# Patient Record
Sex: Female | Born: 1975 | Race: White | Hispanic: Yes | Smoking: Never smoker
Health system: Southern US, Community
[De-identification: ages and names within clinical notes are randomized; demographics above are authoritative.]

## PROBLEM LIST (undated history)

## (undated) DIAGNOSIS — I10 Essential (primary) hypertension: Secondary | ICD-10-CM

## (undated) DIAGNOSIS — Z0000 Encounter for general adult medical examination without abnormal findings: Secondary | ICD-10-CM

## (undated) HISTORY — DX: Encounter for general adult medical examination without abnormal findings: Z00.00

## (undated) HISTORY — PX: KNEE ARTHROSCOPY: SHX127

## (undated) HISTORY — PX: CHOLECYSTECTOMY: SHX55

## (undated) HISTORY — PX: TUBAL LIGATION: SHX77

## (undated) HISTORY — PX: APPENDECTOMY: SHX54

---

## 2005-03-17 ENCOUNTER — Ambulatory Visit: Payer: Self-pay | Admitting: Family Medicine

## 2005-04-15 ENCOUNTER — Ambulatory Visit: Payer: Self-pay | Admitting: Family Medicine

## 2005-11-17 ENCOUNTER — Ambulatory Visit: Payer: Self-pay | Admitting: Family Medicine

## 2006-11-18 ENCOUNTER — Emergency Department: Payer: Self-pay | Admitting: Emergency Medicine

## 2006-12-07 ENCOUNTER — Encounter: Payer: Self-pay | Admitting: Family Medicine

## 2006-12-14 ENCOUNTER — Encounter: Payer: Self-pay | Admitting: Family Medicine

## 2006-12-15 ENCOUNTER — Ambulatory Visit: Payer: Self-pay | Admitting: Family Medicine

## 2008-04-24 ENCOUNTER — Emergency Department: Payer: Self-pay | Admitting: Emergency Medicine

## 2009-06-20 ENCOUNTER — Emergency Department: Payer: Self-pay | Admitting: Emergency Medicine

## 2009-11-27 ENCOUNTER — Ambulatory Visit: Payer: Self-pay | Admitting: Family Medicine

## 2012-09-16 ENCOUNTER — Emergency Department: Payer: Self-pay | Admitting: Emergency Medicine

## 2012-10-23 ENCOUNTER — Ambulatory Visit: Payer: Self-pay | Admitting: Orthopedic Surgery

## 2012-11-06 ENCOUNTER — Ambulatory Visit: Payer: Self-pay | Admitting: Orthopedic Surgery

## 2012-11-06 LAB — PREGNANCY, URINE: Pregnancy Test, Urine: NEGATIVE m[IU]/mL

## 2012-11-09 ENCOUNTER — Ambulatory Visit: Payer: Self-pay | Admitting: Orthopedic Surgery

## 2014-07-05 NOTE — Op Note (Signed)
PATIENT NAME:  Brittany Pratt, Camelia MR#:  [id number] DATE OF BIRTH:  [birth year]  DATE OF PROCEDURE:  11/09/2012  PREOPERATIVE DIAGNOSIS:  Right knee medial and lateral meniscus tears, patellar subluxation.   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   ANESTHESIA:  General.   SURGEON: Leitha SchullerMichael J. Reigna Ruperto, MD  PROCEDURES:  Arthroscopic partial medial meniscectomy and arthroscopic lateral release.    DESCRIPTION OF PROCEDURE:  The patient was brought to the Operating Room and after adequate anesthesia was obtained, the right leg was prepped and draped in usual sterile fashion with a tourniquet applied but not inflated over an arthroscopic legholder applied. After prepping and draping and appropriate patient identification and time-out procedures were completed, an inferolateral portal was made and the arthroscope was introduced. Inspection revealed significant patellar subluxation with minimal chondromalacia. Coming around medially, an inferomedial portal was made and on probing there was a tear of the inner half of the posterior horn of the meniscus that extended into the middle third. There was significant chondromalacia with partial-thickness loss over large areas of the medial femoral condyle. There was no fissuring down to bone. There was less significant degenerative changes on the tibial side. The ACL was intact with good fiber bundle definition and vasculature. Going to the lateral compartment, the lateral compartment appeared normal and extensive probing of the lateral meniscus, it was intact. The gutters were checked and there were no loose bodies. The meniscal punch was then used to debride the meniscus back and the ArthroCare wand was used to smooth the edges. On further probing, the meniscus appeared to have had adequate resection. A lateral release was then carried out switching portals. After release, there was good  patellofemoral tracking, pre- and postprocedure pictures having been obtained. The knee was then irrigated until clear, and the wounds closed with simple interrupted 4-0 nylon. A total of 30 mL of 0.5% Sensorcaine was infiltrated in the subcutaneous tissues and the portals and the area of the lateral release to aid in postoperative analgesia. Xeroform, 4 x 4s, Webril and Ace wrap were applied, and the patient was sent to the Recovery Room in stable condition.   ESTIMATED BLOOD LOSS:  Minimal.   COMPLICATIONS:  None.   SPECIMENS:  None.  ____________________________ Leitha SchullerMichael J. Naya Ilagan, MD mjm:jm D: 11/09/2012 09:03:37 ET T: 11/09/2012 10:05:33 ET JOB#: [id number]  cc: Leitha SchullerMichael J. Kaiyana Bedore, MD, <Dictator> Leitha SchullerMICHAEL J Atilano Covelli MD ELECTRONICALLY SIGNED 11/09/2012 11:09

## 2014-07-17 ENCOUNTER — Other Ambulatory Visit: Payer: Self-pay | Admitting: Primary Care

## 2014-07-17 DIAGNOSIS — R102 Pelvic and perineal pain: Secondary | ICD-10-CM

## 2014-07-19 ENCOUNTER — Ambulatory Visit
Admission: RE | Admit: 2014-07-19 | Discharge: 2014-07-19 | Disposition: A | Discharge status: Home / Self Care | Payer: BLUE CROSS/BLUE SHIELD | Source: Ambulatory Visit | Attending: Primary Care | Admitting: Primary Care

## 2014-07-19 ENCOUNTER — Other Ambulatory Visit: Payer: Self-pay

## 2014-07-19 DIAGNOSIS — R102 Pelvic and perineal pain: Secondary | ICD-10-CM

## 2014-10-28 ENCOUNTER — Other Ambulatory Visit: Payer: Self-pay | Admitting: Orthopedic Surgery

## 2014-10-28 DIAGNOSIS — S83232A Complex tear of medial meniscus, current injury, left knee, initial encounter: Secondary | ICD-10-CM

## 2014-11-01 ENCOUNTER — Ambulatory Visit
Admission: RE | Admit: 2014-11-01 | Discharge: 2014-11-01 | Disposition: A | Discharge status: Home / Self Care | Payer: BLUE CROSS/BLUE SHIELD | Source: Ambulatory Visit | Attending: Orthopedic Surgery | Admitting: Orthopedic Surgery

## 2014-11-01 DIAGNOSIS — M25562 Pain in left knee: Secondary | ICD-10-CM | POA: Diagnosis present

## 2014-11-01 DIAGNOSIS — S83232A Complex tear of medial meniscus, current injury, left knee, initial encounter: Secondary | ICD-10-CM

## 2014-11-01 DIAGNOSIS — M2242 Chondromalacia patellae, left knee: Secondary | ICD-10-CM | POA: Insufficient documentation

## 2014-11-11 ENCOUNTER — Emergency Department
Admission: EM | Admit: 2014-11-11 | Discharge: 2014-11-11 | Disposition: A | Discharge status: Home / Self Care | Payer: BLUE CROSS/BLUE SHIELD | Attending: Emergency Medicine | Admitting: Emergency Medicine

## 2014-11-11 ENCOUNTER — Encounter: Payer: Self-pay | Admitting: Emergency Medicine

## 2014-11-11 ENCOUNTER — Other Ambulatory Visit: Payer: Self-pay

## 2014-11-11 ENCOUNTER — Emergency Department: Payer: BLUE CROSS/BLUE SHIELD

## 2014-11-11 DIAGNOSIS — R1010 Upper abdominal pain, unspecified: Secondary | ICD-10-CM

## 2014-11-11 DIAGNOSIS — K5900 Constipation, unspecified: Secondary | ICD-10-CM | POA: Diagnosis not present

## 2014-11-11 DIAGNOSIS — N390 Urinary tract infection, site not specified: Secondary | ICD-10-CM

## 2014-11-11 DIAGNOSIS — Z3202 Encounter for pregnancy test, result negative: Secondary | ICD-10-CM | POA: Insufficient documentation

## 2014-11-11 DIAGNOSIS — R1012 Left upper quadrant pain: Secondary | ICD-10-CM | POA: Diagnosis present

## 2014-11-11 LAB — CBC WITH DIFFERENTIAL/PLATELET
BASOS ABS: 0.1 10*3/uL (ref 0–0.1)
Basophils Relative: 1 %
EOS PCT: 2 %
Eosinophils Absolute: 0.3 10*3/uL (ref 0–0.7)
HCT: 40.9 % (ref 35.0–47.0)
HEMOGLOBIN: 13.8 g/dL (ref 12.0–16.0)
LYMPHS ABS: 3.3 10*3/uL (ref 1.0–3.6)
LYMPHS PCT: 25 %
MCH: 27.4 pg (ref 26.0–34.0)
MCHC: 33.6 g/dL (ref 32.0–36.0)
MCV: 81.5 fL (ref 80.0–100.0)
Monocytes Absolute: 0.7 10*3/uL (ref 0.2–0.9)
Monocytes Relative: 6 %
NEUTROS ABS: 9 10*3/uL — AB (ref 1.4–6.5)
NEUTROS PCT: 66 %
PLATELETS: 267 10*3/uL (ref 150–440)
RBC: 5.02 MIL/uL (ref 3.80–5.20)
RDW: 13.1 % (ref 11.5–14.5)
WBC: 13.4 10*3/uL — AB (ref 3.6–11.0)

## 2014-11-11 LAB — COMPREHENSIVE METABOLIC PANEL
ALBUMIN: 4.4 g/dL (ref 3.5–5.0)
ALK PHOS: 98 U/L (ref 38–126)
ALT: 25 U/L (ref 14–54)
ANION GAP: 9 (ref 5–15)
AST: 24 U/L (ref 15–41)
BUN: 14 mg/dL (ref 6–20)
CHLORIDE: 101 mmol/L (ref 101–111)
CO2: 26 mmol/L (ref 22–32)
Calcium: 9.2 mg/dL (ref 8.9–10.3)
Creatinine, Ser: 0.61 mg/dL (ref 0.44–1.00)
GFR calc non Af Amer: >60 mL/min (ref >60)
GLUCOSE: 145 mg/dL — AB (ref 65–99)
POTASSIUM: 3.9 mmol/L (ref 3.5–5.1)
SODIUM: 136 mmol/L (ref 135–145)
Total Bilirubin: 0.9 mg/dL (ref 0.3–1.2)
Total Protein: 7.6 g/dL (ref 6.5–8.1)

## 2014-11-11 LAB — URINALYSIS COMPLETE WITH MICROSCOPIC (ARMC ONLY)
Bacteria, UA: NONE SEEN
Bilirubin Urine: NEGATIVE
Glucose, UA: NEGATIVE mg/dL
KETONES UR: NEGATIVE mg/dL
Nitrite: NEGATIVE
PH: 7 (ref 5.0–8.0)
PROTEIN: NEGATIVE mg/dL
SPECIFIC GRAVITY, URINE: 1.003 — AB (ref 1.005–1.030)

## 2014-11-11 LAB — TROPONIN I
TROPONIN I: 0.03 ng/mL (ref <0.031)
Troponin I: <0.03 ng/mL (ref <0.031)

## 2014-11-11 LAB — LIPASE, BLOOD: Lipase: 15 U/L — ABNORMAL LOW (ref 22–51)

## 2014-11-11 LAB — PREGNANCY, URINE: PREG TEST UR: NEGATIVE

## 2014-11-11 MED ORDER — CEFAZOLIN SODIUM 1-5 GM-% IV SOLN
INTRAVENOUS | Status: AC
  Administered 2014-11-11: 2000 mg
  Filled 2014-11-11: qty 100

## 2014-11-11 MED ORDER — ONDANSETRON HCL 4 MG/2ML IJ SOLN
4.0000 mg | Freq: Once | INTRAMUSCULAR | Status: AC
  Administered 2014-11-11: 4 mg via INTRAVENOUS
  Filled 2014-11-11: qty 2

## 2014-11-11 MED ORDER — IOHEXOL 240 MG/ML SOLN
25.0000 mL | Freq: Once | INTRAMUSCULAR | Status: AC | PRN
  Administered 2014-11-11: 25 mL via ORAL

## 2014-11-11 MED ORDER — MORPHINE SULFATE (PF) 2 MG/ML IV SOLN
2.0000 mg | Freq: Once | INTRAVENOUS | Status: AC
  Administered 2014-11-11: 2 mg via INTRAVENOUS
  Filled 2014-11-11: qty 1

## 2014-11-11 MED ORDER — MORPHINE SULFATE (PF) 2 MG/ML IV SOLN
2.0000 mg | Freq: Once | INTRAVENOUS | Status: AC
Start: 2014-11-11 — End: 2014-11-11
  Administered 2014-11-11: 2 mg via INTRAVENOUS
  Filled 2014-11-11: qty 1

## 2014-11-11 MED ORDER — IOHEXOL 300 MG/ML  SOLN
100.0000 mL | Freq: Once | INTRAMUSCULAR | Status: AC | PRN
  Administered 2014-11-11: 100 mL via INTRAVENOUS

## 2014-11-11 MED ORDER — DOCUSATE SODIUM 100 MG PO CAPS: 100.0000 mg | ORAL_CAPSULE | Freq: Two times a day (BID) | ORAL | Status: DC

## 2014-11-11 MED ORDER — CEPHALEXIN 500 MG PO CAPS: 500.0000 mg | ORAL_CAPSULE | Freq: Four times a day (QID) | ORAL | Status: AC

## 2014-11-11 MED ORDER — MORPHINE SULFATE (PF) 4 MG/ML IV SOLN
4.0000 mg | Freq: Once | INTRAVENOUS | Status: AC
  Administered 2014-11-11: 4 mg via INTRAVENOUS
  Filled 2014-11-11: qty 1

## 2014-11-11 MED ORDER — ONDANSETRON HCL 4 MG PO TABS: 4.0000 mg | ORAL_TABLET | Freq: Every day | ORAL | Status: AC | PRN

## 2014-11-11 MED ORDER — CEFAZOLIN SODIUM-DEXTROSE 2-3 GM-% IV SOLR: 2.0000 g | Freq: Once | INTRAVENOUS | Status: DC

## 2014-11-11 MED ORDER — SENNA 8.6 MG PO TABS
2.0000 | ORAL_TABLET | Freq: Two times a day (BID) | ORAL | Status: DC
Start: 2014-11-11 — End: 2016-07-22

## 2014-11-11 MED ORDER — SODIUM CHLORIDE 0.9 % IV SOLN
Freq: Once | INTRAVENOUS | Status: AC
  Administered 2014-11-11: 1000 mL via INTRAVENOUS

## 2014-11-11 NOTE — ED Notes (Signed)
Notified nurse Selena Batten, patient asking for pain medication 10/10 pain at this time.

## 2014-11-11 NOTE — Discharge Instructions (Signed)
Dolor abdominal (Abdominal Pain) El dolor puede tener muchas causas. Normalmente la causa del dolor abdominal no es una enfermedad y Scientist, clinical (histocompatibility and immunogenetics) sin TEFL teacher. Frecuentemente puede controlarse y tratarse en casa. Su mdico le Medical sales representative examen fsico y posiblemente solicite anlisis de sangre y radiografas para ayudar a Chief Strategy Officer la gravedad de su dolor. Sin embargo, en [employer], debe transcurrir ms tiempo antes de que se pueda Clinical research associate una causa evidente del dolor. Antes de llegar a ese punto, es posible que su mdico no sepa si necesita ms pruebas o un tratamiento ms profundo. INSTRUCCIONES PARA EL CUIDADO EN EL HOGAR  Est atento al dolor para ver si hay cambios. Las siguientes indicaciones ayudarn a Architectural technologist que pueda sentir:  Bruin solo medicamentos de venta libre o recetados, segn las indicaciones del mdico.  No tome laxantes a menos que se lo haya indicado su mdico.  Pruebe con Neomia Dear dieta lquida absoluta (caldo, t o agua) segn se lo indique su mdico. Introduzca gradualmente una dieta normal, segn su tolerancia. SOLICITE ATENCIN MDICA SI:  Tiene dolor abdominal sin explicacin.  Tiene dolor abdominal relacionado con nuseas o diarrea.  Tiene dolor cuando orina o defeca.  Experimenta dolor abdominal que lo despierta de noche.  Tiene dolor abdominal que empeora o mejora cuando come alimentos.  Tiene dolor abdominal que empeora cuando come alimentos grasosos.  Tiene fiebre. SOLICITE ATENCIN MDICA DE INMEDIATO SI:   El dolor no desaparece en un plazo mximo de 2horas.  No deja de (vomitar).  El Engineer, mining se siente solo en partes del abdomen, como el lado derecho o la parte inferior izquierda del abdomen.  Evaca materia fecal sanguinolenta o negra, de aspecto alquitranado. ASEGRESE DE QUE:  Comprende estas instrucciones.  Controlar su afeccin.  Recibir ayuda de inmediato si no mejora o si empeora. Document Released: 03/01/2005  Document Revised: 03/06/2013 [facility] Patient Information 2015 [place], [place]. This information is not intended to replace advice given to you by your health care provider. Make sure you discuss any questions you have with your health care provider.  Please return for fever, continued vomiting or feeling sicker.   Be sure to drink enough fluid.   Take keflex 1 pill 4 x a day for the urinary tract infection.  Use zofran if needed for the vomiting

## 2014-11-11 NOTE — ED Provider Notes (Signed)
[facility] Emergency Department Provider Note  ____________________________________________  Time seen: Approximately 4:13 AM  I have reviewed the triage vital signs and the nursing notes.   HISTORY  Chief Complaint Abdominal Pain and Back Pain    HPI Brittany Pratt is a 39 y.o. female who reports belly pain for about a week is been getting worse especially in the last 2 days. Pain starts in the right CVA area wraps around the upper abdomen to the left CVA area. Patient has been having vomiting but no diarrhea. She told the nurse she did have a stool couple days ago was very hard. She has not had this kind of pain before. Seems to be worse when she breathes or moves. Says his chest pain when I ask her if it's crampy and burning sharp or stabbing she says chest pain. Nothing else seems to be associated with it.   History reviewed. No pertinent past medical history.  There are no active problems to display for this patient.   Past Surgical History  Procedure Laterality Date  . Appendectomy    . Cholecystectomy    . Cesarean section      x 3  . Knee arthroscopy      Current Outpatient Rx  Name  Route  Sig  Dispense  Refill  . naproxen (NAPROSYN) 500 MG tablet   Oral   Take 500 mg by mouth 2 (two) times daily with a meal.         . traMADol (ULTRAM) 50 MG tablet   Oral   Take 50 mg by mouth 3 (three) times daily as needed.         . cephALEXin (KEFLEX) 500 MG capsule   Oral   Take 1 capsule (500 mg total) by mouth 4 (four) times daily.   40 capsule   0   . ondansetron (ZOFRAN) 4 MG tablet   Oral   Take 1 tablet (4 mg total) by mouth daily as needed for nausea or vomiting.   9 tablet   1     Allergies Review of patient's allergies indicates no known allergies.  History reviewed. No pertinent family history.  Social History Social History  Substance Use Topics  . Smoking status: Never Smoker   . Smokeless tobacco:  None  . Alcohol Use: No    Review of Systems Constitutional: No fever/chills Eyes: No visual changes. ENT: No sore throat. Cardiovascular: Denies chest pain. Respiratory: Denies shortness of breath. Gastrointestinal:   No diarrhea.  Genitourinary:dysuria. Musculoskeletal: See history of present illness Skin: Negative for rash. Neurological: Negative for headaches, focal weakness or numbness.  10-point ROS otherwise negative.  ____________________________________________   PHYSICAL EXAM:  VITAL SIGNS: ED Triage Vitals  Enc Vitals Group     BP 11/11/14 0350 148/109 mmHg     Pulse --      Resp 11/11/14 0350 24     Temp 11/11/14 0350 98 F (36.7 C)     Temp Source 11/11/14 0350 Oral     SpO2 11/11/14 0350 100 %     Weight 11/11/14 0350 210 lb (95.255 kg)     Height 11/11/14 0350 5' (1.524 m)     Head Cir --      Peak Flow --      Pain Score 11/11/14 0351 10     Pain Loc --      Pain Edu? --      Excl. in GC? --  Constitutional: Alert and oriented. Patient moaning and moving around in the bed saying that her belly hurts. Eyes: Conjunctivae are normal. PERRL. EOMI. Head: Atraumatic. Nose: No congestion/rhinnorhea. Mouth/Throat: Mucous membranes are moist.  Oropharynx non-erythematous. Neck: No stridor. Cardiovascular: Normal rate, regular rhythm. Grossly normal heart sounds.  Good peripheral circulation. Respiratory: Normal respiratory effort.  No retractions. Lungs CTAB. Gastrointestinal: Abdomen is soft bowel sounds are positive but decreased is diffuse tenderness to palpation percussion.  Musculoskeletal: No lower extremity tenderness nor edema.  No joint effusions. Neurologic:  Normal speech and language. No gross focal neurologic deficits are appreciated. No gait instability. Skin:  Skin is warm, dry and intact. No rash noted. Psychiatric: Mood and affect are normal. Speech and behavior are normal.  ____________________________________________   LABS (all  labs ordered are listed, but only abnormal results are displayed)  Labs Reviewed  COMPREHENSIVE METABOLIC PANEL - Abnormal; Notable for the following:    Glucose, Bld 145 (*)    All other components within normal limits  LIPASE, BLOOD - Abnormal; Notable for the following:    Lipase 15 (*)    All other components within normal limits  CBC WITH DIFFERENTIAL/PLATELET - Abnormal; Notable for the following:    WBC 13.4 (*)    Neutro Abs 9.0 (*)    All other components within normal limits  URINALYSIS COMPLETEWITH MICROSCOPIC ([facility] ONLY) - Abnormal; Notable for the following:    Color, Urine STRAW (*)    APPearance CLEAR (*)    Specific Gravity, Urine 1.003 (*)    Hgb urine dipstick 2+ (*)    Leukocytes, UA 3+ (*)    Squamous Epithelial / LPF 0-5 (*)    All other components within normal limits  TROPONIN I  PREGNANCY, URINE  TROPONIN I   ____________________________________________  EKG  EKG read and interpreted by me shows normal sinus rhythm a rate of 89 normal axis computer is reading a short PR interval 108 ms. There are some nonspecific ST-T wave changes in the computer also relates a prolonged QRS and QT interval. QTC computer calculates is 476 ms ____________________________________________  RADIOLOGY  KUB shows a lot of stool radiologist also raises somewhat agrees with this. T scan is read as no acute disease by the radiologist ____________________________________________   PROCEDURES    ____________________________________________   INITIAL IMPRESSION / ASSESSMENT AND PLAN / ED COURSE  Pertinent labs & imaging results that were available during my care of the patient were reviewed by me and considered in my medical decision making (see chart for details). We'll give patient an enema see if this improves the pain. We'll treat her for her UTIDr. Scotty Court will check a second troponin I will write her up for discharge assuming the second troponin is  negative  ____________________________________________   FINAL CLINICAL IMPRESSION(S) / ED DIAGNOSES  Final diagnoses:  Pain of upper abdomen  Urinary tract infection without hematuria, site unspecified  Constipation, unspecified constipation type      Arnaldo Natal, MD 11/11/14 [phone]

## 2014-11-11 NOTE — ED Notes (Signed)
Pt reports 1 week of intermittent mid back pain radiating around upper abd; worse over the last 2 days; N/V; no diarrhea; last BM was 2 days ago; hard;

## 2015-07-23 DIAGNOSIS — R1013 Epigastric pain: Secondary | ICD-10-CM | POA: Diagnosis not present

## 2015-07-23 DIAGNOSIS — R42 Dizziness and giddiness: Secondary | ICD-10-CM | POA: Diagnosis not present

## 2015-11-04 DIAGNOSIS — J029 Acute pharyngitis, unspecified: Secondary | ICD-10-CM | POA: Diagnosis not present

## 2016-03-05 ENCOUNTER — Encounter: Payer: Self-pay | Admitting: Gynecology

## 2016-03-05 ENCOUNTER — Ambulatory Visit
Admission: EM | Admit: 2016-03-05 | Discharge: 2016-03-05 | Disposition: A | Discharge status: Home / Self Care | Payer: BLUE CROSS/BLUE SHIELD | Attending: Family Medicine | Admitting: Family Medicine

## 2016-03-05 DIAGNOSIS — J029 Acute pharyngitis, unspecified: Secondary | ICD-10-CM | POA: Diagnosis not present

## 2016-03-05 DIAGNOSIS — B2790 Infectious mononucleosis, unspecified without complication: Secondary | ICD-10-CM | POA: Diagnosis not present

## 2016-03-05 DIAGNOSIS — B373 Candidiasis of vulva and vagina: Secondary | ICD-10-CM

## 2016-03-05 DIAGNOSIS — N898 Other specified noninflammatory disorders of vagina: Secondary | ICD-10-CM

## 2016-03-05 DIAGNOSIS — B3731 Acute candidiasis of vulva and vagina: Secondary | ICD-10-CM

## 2016-03-05 LAB — WET PREP, GENITAL
CLUE CELLS WET PREP: NONE SEEN
Sperm: NONE SEEN
Trich, Wet Prep: NONE SEEN
YEAST WET PREP: NONE SEEN

## 2016-03-05 LAB — CHLAMYDIA/NGC RT PCR (ARMC ONLY)
CHLAMYDIA TR: NOT DETECTED
N gonorrhoeae: NOT DETECTED

## 2016-03-05 LAB — URINALYSIS, COMPLETE (UACMP) WITH MICROSCOPIC
Bilirubin Urine: NEGATIVE
Glucose, UA: NEGATIVE mg/dL
HGB URINE DIPSTICK: NEGATIVE
Ketones, ur: NEGATIVE mg/dL
Leukocytes, UA: NEGATIVE
Nitrite: NEGATIVE
PROTEIN: NEGATIVE mg/dL
RBC / HPF: NONE SEEN RBC/hpf (ref 0–5)
Specific Gravity, Urine: 1.025 (ref 1.005–1.030)
pH: 5.5 (ref 5.0–8.0)

## 2016-03-05 LAB — RAPID INFLUENZA A&B ANTIGENS
Influenza A (ARMC): NEGATIVE
Influenza B (ARMC): NEGATIVE

## 2016-03-05 LAB — RAPID STREP SCREEN (MED CTR MEBANE ONLY): Streptococcus, Group A Screen (Direct): NEGATIVE

## 2016-03-05 LAB — MONONUCLEOSIS SCREEN: Mono Screen: POSITIVE — AB

## 2016-03-05 MED ORDER — FLUCONAZOLE 150 MG PO TABS: 150.0000 mg | ORAL_TABLET | Freq: Every day | ORAL | 1 refills | Status: DC

## 2016-03-05 NOTE — ED Provider Notes (Signed)
[facility]    CSN: [id number] Arrival date & time: 03/05/16  1233     History   Chief Complaint Chief Complaint  Patient presents with  . Urinary Tract Infection    HPI Brittany Pratt is a 40 y.o. female.   40 yo female with a several complaints: 1. c/o 4 days h/o sore throat, fevers, fatigue, swollen neck glands; denies any chest pains, shortness of breath, wheezing. 2. C/o one week h/o vaginal itching, burning sensation, discomfort and white discharge. Also associated with dysuria. Denies any hematuria.    The history is provided by the patient.  Urinary Tract Infection    History reviewed. No pertinent past medical history.  There are no active problems to display for this patient.   Past Surgical History:  Procedure Laterality Date  . APPENDECTOMY    . CESAREAN SECTION     x 3  . CHOLECYSTECTOMY    . KNEE ARTHROSCOPY      OB History    No data available       Home Medications    Prior to Admission medications   Medication Sig Start Date End Date Taking? Authorizing Provider  docusate sodium (COLACE) 100 MG capsule Take 1 capsule (100 mg total) by mouth 2 (two) times daily. 11/11/14   Sharman Cheek, MD  fluconazole (DIFLUCAN) 150 MG tablet Take 1 tablet (150 mg total) by mouth daily. 03/05/16   Payton Mccallum, MD  naproxen (NAPROSYN) 500 MG tablet Take 500 mg by mouth 2 (two) times daily with a meal.    Historical Provider, MD  senna (SENOKOT) 8.6 MG TABS tablet Take 2 tablets (17.2 mg total) by mouth 2 (two) times daily. 11/11/14   Sharman Cheek, MD  traMADol (ULTRAM) 50 MG tablet Take 50 mg by mouth 3 (three) times daily as needed.    Historical Provider, MD    Family History No family history on file.  Social History Social History  Substance Use Topics  . Smoking status: Never Smoker  . Smokeless tobacco: Never Used  . Alcohol use No     Allergies   Patient has no known allergies.   Review of  Systems Review of Systems   Physical Exam Triage Vital Signs ED Triage Vitals  Enc Vitals Group     BP 03/05/16 1249 136/80     Pulse Rate 03/05/16 1249 63     Resp 03/05/16 1249 16     Temp 03/05/16 1249 98.1 F (36.7 C)     Temp Source 03/05/16 1249 Oral     SpO2 03/05/16 1249 100 %     Weight 03/05/16 1251 194 lb (88 kg)     Height 03/05/16 1251 4\' 10"  (1.473 m)     Head Circumference --      Peak Flow --      Pain Score 03/05/16 1255 6     Pain Loc --      Pain Edu? --      Excl. in GC? --    No data found.   Updated Vital Signs BP 136/80 (BP Location: Left Arm)   Pulse 63   Temp 98.1 F (36.7 C) (Oral)   Resp 16   Ht 4\' 10"  (1.473 m)   Wt 194 lb (88 kg)   LMP 02/09/2016   SpO2 100%   BMI 40.55 kg/m   Visual Acuity Right Eye Distance:   Left Eye Distance:   Bilateral Distance:    Right Eye Near:  Left Eye Near:    Bilateral Near:     Physical Exam  Constitutional: She appears well-developed and well-nourished. No distress.  HENT:  Head: Normocephalic and atraumatic.  Right Ear: Tympanic membrane, external ear and ear canal normal.  Left Ear: Tympanic membrane, external ear and ear canal normal.  Nose: No mucosal edema, rhinorrhea, nose lacerations, sinus tenderness, nasal deformity, septal deviation or nasal septal hematoma. No epistaxis.  No foreign bodies. Right sinus exhibits no maxillary sinus tenderness and no frontal sinus tenderness. Left sinus exhibits no maxillary sinus tenderness and no frontal sinus tenderness.  Mouth/Throat: Uvula is midline and mucous membranes are normal. Posterior oropharyngeal erythema present. No oropharyngeal exudate, posterior oropharyngeal edema or tonsillar abscesses. No tonsillar exudate.  Eyes: Conjunctivae and EOM are normal. Pupils are equal, round, and reactive to light. Right eye exhibits no discharge. Left eye exhibits no discharge. No scleral icterus.  Neck: Normal range of motion. Neck supple. No thyromegaly  present.  Cardiovascular: Normal rate, regular rhythm and normal heart sounds.   Pulmonary/Chest: Effort normal and breath sounds normal. No respiratory distress. She has no wheezes. She has no rales.  Genitourinary: Pelvic exam was performed with patient supine. Cervix exhibits no discharge and no friability. No erythema, tenderness or bleeding in the vagina. No foreign body in the vagina. No signs of injury around the vagina. Vaginal discharge found.  Lymphadenopathy:    She has no cervical adenopathy.  Skin: She is not diaphoretic.  Nursing note and vitals reviewed.    UC Treatments / Results  Labs (all labs ordered are listed, but only abnormal results are displayed) Labs Reviewed  WET PREP, GENITAL - Abnormal; Notable for the following:       Result Value   WBC, Wet Prep HPF POC MANY (*)    All other components within normal limits  URINALYSIS, COMPLETE (UACMP) WITH MICROSCOPIC - Abnormal; Notable for the following:    Squamous Epithelial / LPF 6-30 (*)    Bacteria, UA FEW (*)    All other components within normal limits  MONONUCLEOSIS SCREEN - Abnormal; Notable for the following:    Mono Screen POSITIVE (*)    All other components within normal limits  RAPID STREP SCREEN (NOT AT [facility])  RAPID INFLUENZA A&B ANTIGENS ([facility] ONLY)  URINE CULTURE  CHLAMYDIA/NGC RT PCR ([facility] ONLY)  CULTURE, GROUP A STREP [facility])    EKG  EKG Interpretation None       Radiology No results found.  Procedures Procedures (including critical care time)  Medications Ordered in UC Medications - No data to display   Initial Impression / Assessment and Plan / UC Course  I have reviewed the triage vital signs and the nursing notes.  Pertinent labs & imaging results that were available during my care of the patient were reviewed by me and considered in my medical decision making (see chart for details).  Clinical Course      Final Clinical Impressions(s) / UC Diagnoses   Final  diagnoses:  Pharyngitis, unspecified etiology  Mononucleosis  Vaginal discharge  Yeast vaginitis    New Prescriptions Discharge Medication List as of 03/05/2016  2:46 PM    START taking these medications   Details  fluconazole (DIFLUCAN) 150 MG tablet Take 1 tablet (150 mg total) by mouth daily., Starting Fri 03/05/2016, Normal       1. Labs results and diagnoses reviewed with patient 2. rx as per orders above; reviewed possible side effects, interactions, risks and benefits  3.  Recommend supportive treatment with rest, fluids, otc analgesics 4. Follow-up prn if symptoms worsen or don't improve   Payton Mccallumrlando Tobie Hellen, MD 03/05/16 1529

## 2016-03-05 NOTE — ED Triage Notes (Signed)
Per patient burning with urination x 2 weeks. Patient stated felt warm at home but never took her temperature.

## 2016-03-06 LAB — URINE CULTURE: CULTURE: NO GROWTH

## 2016-03-08 LAB — CULTURE, GROUP A STREP (THRC)

## 2016-06-28 ENCOUNTER — Ambulatory Visit
Admission: EM | Admit: 2016-06-28 | Discharge: 2016-06-28 | Disposition: A | Discharge status: Home / Self Care | Payer: BLUE CROSS/BLUE SHIELD | Attending: Family Medicine | Admitting: Family Medicine

## 2016-06-28 ENCOUNTER — Encounter: Payer: Self-pay | Admitting: *Deleted

## 2016-06-28 DIAGNOSIS — N920 Excessive and frequent menstruation with regular cycle: Secondary | ICD-10-CM | POA: Diagnosis not present

## 2016-06-28 DIAGNOSIS — R079 Chest pain, unspecified: Secondary | ICD-10-CM | POA: Diagnosis not present

## 2016-06-28 DIAGNOSIS — R109 Unspecified abdominal pain: Secondary | ICD-10-CM

## 2016-06-28 DIAGNOSIS — N939 Abnormal uterine and vaginal bleeding, unspecified: Secondary | ICD-10-CM | POA: Diagnosis not present

## 2016-06-28 DIAGNOSIS — R102 Pelvic and perineal pain: Secondary | ICD-10-CM | POA: Diagnosis not present

## 2016-06-28 DIAGNOSIS — R1013 Epigastric pain: Secondary | ICD-10-CM | POA: Diagnosis not present

## 2016-06-28 DIAGNOSIS — R51 Headache: Secondary | ICD-10-CM

## 2016-06-28 LAB — COMPREHENSIVE METABOLIC PANEL
ALT: 17 U/L (ref 14–54)
AST: 17 U/L (ref 15–41)
Albumin: 4.2 g/dL (ref 3.5–5.0)
Alkaline Phosphatase: 60 U/L (ref 38–126)
Anion gap: 6 (ref 5–15)
BUN: 13 mg/dL (ref 6–20)
CO2: 31 mmol/L (ref 22–32)
Calcium: 9.5 mg/dL (ref 8.9–10.3)
Chloride: 99 mmol/L — ABNORMAL LOW (ref 101–111)
Creatinine, Ser: 0.56 mg/dL (ref 0.44–1.00)
GFR calc Af Amer: >60 mL/min (ref >60)
GFR calc non Af Amer: >60 mL/min (ref >60)
Glucose, Bld: 100 mg/dL — ABNORMAL HIGH (ref 65–99)
Potassium: 4.3 mmol/L (ref 3.5–5.1)
Sodium: 136 mmol/L (ref 135–145)
Total Bilirubin: 0.9 mg/dL (ref 0.3–1.2)
Total Protein: 7.5 g/dL (ref 6.5–8.1)

## 2016-06-28 LAB — CBC
HCT: 39.4 % (ref 35.0–47.0)
Hemoglobin: 12.9 g/dL (ref 12.0–16.0)
MCH: 26.8 pg (ref 26.0–34.0)
MCHC: 32.9 g/dL (ref 32.0–36.0)
MCV: 81.5 fL (ref 80.0–100.0)
Platelets: 218 10*3/uL (ref 150–440)
RBC: 4.83 MIL/uL (ref 3.80–5.20)
RDW: 13.2 % (ref 11.5–14.5)
WBC: 11.9 10*3/uL — ABNORMAL HIGH (ref 3.6–11.0)

## 2016-06-28 LAB — URINALYSIS, COMPLETE (UACMP) WITH MICROSCOPIC
Bacteria, UA: NONE SEEN
Bilirubin Urine: NEGATIVE
Glucose, UA: NEGATIVE mg/dL
Ketones, ur: 15 mg/dL — AB
Nitrite: NEGATIVE
Protein, ur: 100 mg/dL — AB
Specific Gravity, Urine: 1.025 (ref 1.005–1.030)
WBC, UA: NONE SEEN WBC/hpf (ref 0–5)
pH: 6 (ref 5.0–8.0)

## 2016-06-28 LAB — LIPASE, BLOOD: Lipase: 16 U/L (ref 11–51)

## 2016-06-28 LAB — PREGNANCY, URINE: PREG TEST UR: NEGATIVE

## 2016-06-28 MED ORDER — ONDANSETRON 8 MG PO TBDP
8.0000 mg | ORAL_TABLET | Freq: Once | ORAL | Status: AC
  Administered 2016-06-28: 8 mg via ORAL

## 2016-06-28 MED ORDER — KETOROLAC TROMETHAMINE 30 MG/ML IJ SOLN
30.0000 mg | Freq: Once | INTRAMUSCULAR | Status: AC
  Administered 2016-06-28: 30 mg via INTRAMUSCULAR

## 2016-06-28 NOTE — ED Triage Notes (Signed)
Headache and lower quadrant abd pain with nausea since yesterday. Also, chills, body aches.

## 2016-06-28 NOTE — ED Notes (Signed)
Patient waited 15 minutes and no reactions were noted. Injection site is unremarkable. 

## 2016-06-28 NOTE — ED Provider Notes (Signed)
[facility] ____________________________________________  Time seen: Approximately 8:25 PM  I have reviewed the triage vital signs and the nursing notes.   HISTORY  Chief Complaint Abdominal Pain and Headache  Interpreter offered, patient declined.  HPI Brittany Pratt is a 41 y.o. female presenting for evaluation of pelvic pain and discomfort that has been present for the last 2-3 days. Patient reports she is currently on her menstrual cycle which started in the same timeframe. Patient reports that she does have some similar pain with normal menstruals but reports current menstrual pain is worse than normal at 9/10 cramping and "twisting." Patient states that she is currently having 9 out of 10 suprapubic and right lower suprapubic pain present. Denies abnormal vaginal discharge, vaginal odor area denies concerns of STDs. Denies urinary frequency, urgency or burning with urination .Reports husband with vasectomy, declines chance of pregnancy. Patient reports she went through 10 large pads today with heavy bleeding and associated blood clots. Denies history of anemia.   Patient also stating that she is having a throbbing generalized headache that is mild to moderate with some occasional dizziness. Patient reports her current headache is consistent with her normal menstrual headaches. Patient reports that she did take some Tylenol around 1:00 today which helped some. Denies any syncopal or near syncopal events. Denies current dizziness or vision changes.Denies chest pain, shortness of breath, extremity pain, extremity swelling or rash. Denies recent sickness. Denies recent antibiotic use. Previous abdominal surgeries include 3 C-sections, appendectomy and cholecystectomy. Does report she has been having some runny nose and nasal congestion consistent with seasonal allergies.  PCP : [facility]   History reviewed. No pertinent past medical history.  There are no  active problems to display for this patient.   Past Surgical History:  Procedure Laterality Date  . APPENDECTOMY    . CESAREAN SECTION     x 3  . CHOLECYSTECTOMY    . KNEE ARTHROSCOPY      No current facility-administered medications for this encounter.   Current Outpatient Prescriptions:  .  naproxen (NAPROSYN) 500 MG tablet, Take 500 mg by mouth 2 (two) times daily with a meal., Disp: , Rfl:  .  docusate sodium (COLACE) 100 MG capsule, Take 1 capsule (100 mg total) by mouth 2 (two) times daily., Disp: 120 capsule, Rfl: 0 .  fluconazole (DIFLUCAN) 150 MG tablet, Take 1 tablet (150 mg total) by mouth daily., Disp: 1 tablet, Rfl: 1 .  senna (SENOKOT) 8.6 MG TABS tablet, Take 2 tablets (17.2 mg total) by mouth 2 (two) times daily., Disp: 120 each, Rfl: 0 .  traMADol (ULTRAM) 50 MG tablet, Take 50 mg by mouth 3 (three) times daily as needed., Disp: , Rfl:   Allergies Patient has no known allergies.  History reviewed. No pertinent family history.  Social History Social History  Substance Use Topics  . Smoking status: Never Smoker  . Smokeless tobacco: Never Used  . Alcohol use No    Review of Systems Constitutional: No fever/chills Eyes: No visual changes. ENT: No sore throat. Cardiovascular: Denies chest pain. Respiratory: Denies shortness of breath. Gastrointestinal: No nausea, no vomiting.  No diarrhea.  No constipation. Genitourinary: Negative for dysuria. Musculoskeletal: Negative for back pain. Skin: Negative for rash.   ____________________________________________   PHYSICAL EXAM:  VITAL SIGNS: ED Triage Vitals  Enc Vitals Group     BP 06/28/16 1901 (!) 156/82     Pulse Rate 06/28/16 1901 (!) 57     Resp 06/28/16  1901 16     Temp 06/28/16 1901 98 F (36.7 C)     Temp Source 06/28/16 1901 Oral     SpO2 06/28/16 1901 100 %     Weight 06/28/16 1902 194 lb (88 kg)     Height 06/28/16 1902 5' (1.524 m)     Head Circumference --      Peak Flow --       Pain Score --      Pain Loc --      Pain Edu? --      Excl. in GC? --     Constitutional: Alert and oriented. Well appearing and in no acute distress. ENT      Head: Normocephalic and atraumatic.      Nose: No congestion/rhinnorhea.      Mouth/Throat: Mucous membranes are moist.Oropharynx non-erythematous.. Cardiovascular: Normal rate, regular rhythm. Grossly normal heart sounds.  Good peripheral circulation. Respiratory: Normal respiratory effort without tachypnea nor retractions. Breath sounds are clear and equal bilaterally. No wheezes, rales, rhonchi. Gastrointestinal: Mild to moderate suprapubic and right suprapubic tenderness to palpation, mild guarding. Abdomen otherwise soft and nontender.. Normal Bowel sounds. No CVA tenderness. Musculoskeletal:  No midline cervical, thoracic or lumbar tenderness to palpation.  Neurologic:  Normal speech and language. Speech is normal. No gait instability. No focal neurological deficits. Negative Romberg. Negative pronator drift. Skin:  Skin is warm, dry and intact. No rash noted. Psychiatric: Mood and affect are normal. Speech and behavior are normal. Patient exhibits appropriate insight and judgment   ___________________________________________   LABS (all labs ordered are listed, but only abnormal results are displayed)  Labs Reviewed  URINALYSIS, COMPLETE (UACMP) WITH MICROSCOPIC - Abnormal; Notable for the following:       Result Value   Color, Urine RED (*)    APPearance HAZY (*)    Hgb urine dipstick LARGE (*)    Ketones, ur 15 (*)    Protein, ur 100 (*)    Leukocytes, UA TRACE (*)    Squamous Epithelial / LPF 0-5 (*)    All other components within normal limits  PREGNANCY, URINE   ____________________________________________   PROCEDURES Procedures   INITIAL IMPRESSION / ASSESSMENT AND PLAN / ED COURSE  Pertinent labs & imaging results that were available during my care of the patient were reviewed by me and considered in  my medical decision making (see chart for details).  Overall well-appearing patient. Discussed patient suspect dementia related headache is consistent with normal menstrual headaches per patient. Urine pregnancy negative. Urinalysis reviewed suspect contamination. However increase of pain with increased vaginal bleeding per patient which is abnormal and per patient severe. 30 mg IM Toradol and 8 mg ODT Zofran given once in urgent care. Patient does not currently have an OB/GYN and states pain is worse than previous. Discussed with patient outpatient follow-up including OBGYN as no ultrasound available at this facility at this time however patient reports pain continues to worsen. As pain continues to worsen recommend patient to proceed to emergency room at this time for further evaluation including likely pelvic ultrasound. Patient verbalized understanding. Patient states that her family will take her to [place] [facility]. Sue Lush RN charge nurse called and report. Patient stable at time of discharge.   ____________________________________________   FINAL CLINICAL IMPRESSION(S) / ED DIAGNOSES  Final diagnoses:  Pelvic pain  Menorrhagia with regular cycle     Discharge Medication List as of 06/28/2016  8:24 PM      Note: This dictation was  prepared with Dragon dictation along with smaller phrase technology. Any transcriptional errors that result from this process are unintentional.         Renford Dills, NP 06/28/16 2053

## 2016-06-28 NOTE — Discharge Instructions (Signed)
Go directly to Emergency room as discussed.  °

## 2016-06-29 ENCOUNTER — Emergency Department: Payer: BLUE CROSS/BLUE SHIELD

## 2016-06-29 ENCOUNTER — Encounter: Payer: Self-pay | Admitting: Radiology

## 2016-06-29 ENCOUNTER — Emergency Department
Admission: EM | Admit: 2016-06-29 | Discharge: 2016-06-29 | Disposition: A | Discharge status: Home / Self Care | Payer: BLUE CROSS/BLUE SHIELD | Attending: Emergency Medicine | Admitting: Emergency Medicine

## 2016-06-29 DIAGNOSIS — R079 Chest pain, unspecified: Secondary | ICD-10-CM | POA: Diagnosis not present

## 2016-06-29 DIAGNOSIS — N920 Excessive and frequent menstruation with regular cycle: Secondary | ICD-10-CM

## 2016-06-29 DIAGNOSIS — R102 Pelvic and perineal pain: Secondary | ICD-10-CM

## 2016-06-29 DIAGNOSIS — R1013 Epigastric pain: Secondary | ICD-10-CM | POA: Diagnosis not present

## 2016-06-29 DIAGNOSIS — R52 Pain, unspecified: Secondary | ICD-10-CM

## 2016-06-29 DIAGNOSIS — N939 Abnormal uterine and vaginal bleeding, unspecified: Secondary | ICD-10-CM | POA: Diagnosis not present

## 2016-06-29 LAB — TROPONIN I: Troponin I: <0.03 ng/mL (ref <0.03)

## 2016-06-29 MED ORDER — MORPHINE SULFATE (PF) 2 MG/ML IV SOLN
2.0000 mg | Freq: Once | INTRAVENOUS | Status: AC
  Administered 2016-06-29: 2 mg via INTRAMUSCULAR
  Filled 2016-06-29: qty 1

## 2016-06-29 MED ORDER — FENTANYL CITRATE (PF) 100 MCG/2ML IJ SOLN
50.0000 ug | Freq: Once | INTRAMUSCULAR | Status: AC
  Administered 2016-06-29: 50 ug via INTRAVENOUS
  Filled 2016-06-29: qty 2

## 2016-06-29 MED ORDER — FAMOTIDINE IN NACL 20-0.9 MG/50ML-% IV SOLN
20.0000 mg | Freq: Once | INTRAVENOUS | Status: AC
  Administered 2016-06-29: 20 mg via INTRAVENOUS

## 2016-06-29 MED ORDER — IBUPROFEN 800 MG PO TABS: 800.0000 mg | ORAL_TABLET | Freq: Three times a day (TID) | ORAL | 0 refills | Status: DC | PRN

## 2016-06-29 MED ORDER — IOPAMIDOL (ISOVUE-370) INJECTION 76%
100.0000 mL | Freq: Once | INTRAVENOUS | Status: AC | PRN
Start: 2016-06-29 — End: 2016-06-29
  Administered 2016-06-29: 100 mL via INTRAVENOUS

## 2016-06-29 MED ORDER — LORAZEPAM 2 MG/ML IJ SOLN
0.5000 mg | Freq: Once | INTRAMUSCULAR | Status: AC
  Administered 2016-06-29: 0.5 mg via INTRAVENOUS
  Filled 2016-06-29: qty 1

## 2016-06-29 MED ORDER — ONDANSETRON HCL 4 MG/2ML IJ SOLN
4.0000 mg | Freq: Once | INTRAMUSCULAR | Status: AC
  Administered 2016-06-29: 4 mg via INTRAVENOUS
  Filled 2016-06-29: qty 2

## 2016-06-29 MED ORDER — HYDROCODONE-ACETAMINOPHEN 5-325 MG PO TABS: 1.0000 | ORAL_TABLET | Freq: Four times a day (QID) | ORAL | 0 refills | Status: DC | PRN

## 2016-06-29 MED ORDER — SODIUM CHLORIDE 0.9 % IV BOLUS (SEPSIS)
1000.0000 mL | Freq: Once | INTRAVENOUS | Status: AC
  Administered 2016-06-29: 1000 mL via INTRAVENOUS

## 2016-06-29 MED ORDER — ONDANSETRON 4 MG PO TBDP
4.0000 mg | ORAL_TABLET | Freq: Once | ORAL | Status: AC
  Administered 2016-06-29: 4 mg via ORAL
  Filled 2016-06-29: qty 1

## 2016-06-29 MED ORDER — FAMOTIDINE IN NACL 20-0.9 MG/50ML-% IV SOLN
INTRAVENOUS | Status: AC
  Administered 2016-06-29: 20 mg via INTRAVENOUS
  Filled 2016-06-29: qty 50

## 2016-06-29 NOTE — Discharge Instructions (Signed)
1. You may take pain medicines as needed (Motrin/Norco #15). 2. Return to the ER for worsening symptoms, fainting, soaking more than 1 pad per hour, or other concerns.

## 2016-06-29 NOTE — ED Provider Notes (Addendum)
[facility] Emergency Department Provider Note   ____________________________________________   First MD Initiated Contact with Patient 06/29/16 0102     (approximate)  I have reviewed the triage vital signs and the nursing notes.   HISTORY  Chief Complaint Abdominal Pain and Vaginal Bleeding  History obtained via Spanish interpreter  HPI Brittany Pratt is a 41 y.o. female who presents to the ED from urgent care for pelvic ultrasound. Patient was seen at urgent care for menorrhagia. Reports monthly heavy periods but current menstrual pain is worse than usual.Reports feeling slightly dizzy and going through 10 pads today. Patient received Toradol at urgent care and is no longer complaining of generalized headache which she mentioned during her urgent care visit.   Past medical history None  There are no active problems to display for this patient.   Past Surgical History:  Procedure Laterality Date  . APPENDECTOMY    . CESAREAN SECTION     x 3  . CHOLECYSTECTOMY    . KNEE ARTHROSCOPY      Prior to Admission medications   Medication Sig Start Date End Date Taking? Authorizing Provider  docusate sodium (COLACE) 100 MG capsule Take 1 capsule (100 mg total) by mouth 2 (two) times daily. 11/11/14   Sharman Cheek, MD  fluconazole (DIFLUCAN) 150 MG tablet Take 1 tablet (150 mg total) by mouth daily. 03/05/16   Payton Mccallum, MD  HYDROcodone-acetaminophen (NORCO) 5-325 MG tablet Take 1 tablet by mouth every 6 (six) hours as needed for moderate pain. 06/29/16   Irean Hong, MD  ibuprofen (ADVIL,MOTRIN) 800 MG tablet Take 1 tablet (800 mg total) by mouth every 8 (eight) hours as needed for moderate pain. 06/29/16   Irean Hong, MD  naproxen (NAPROSYN) 500 MG tablet Take 500 mg by mouth 2 (two) times daily with a meal.    Historical Provider, MD  senna (SENOKOT) 8.6 MG TABS tablet Take 2 tablets (17.2 mg total) by mouth 2 (two) times daily.  11/11/14   Sharman Cheek, MD  traMADol (ULTRAM) 50 MG tablet Take 50 mg by mouth 3 (three) times daily as needed.    Historical Provider, MD    Allergies Patient has no known allergies.  No family history on file.  Social History Social History  Substance Use Topics  . Smoking status: Never Smoker  . Smokeless tobacco: Never Used  . Alcohol use No    Review of Systems  Constitutional: No fever/chills. Eyes: No visual changes. ENT: No sore throat. Cardiovascular: Denies chest pain. Respiratory: Denies shortness of breath. Gastrointestinal: No abdominal pain.  No nausea, no vomiting.  No diarrhea.  No constipation. Genitourinary: Positive for heavy period. Negative for dysuria. Musculoskeletal: Negative for back pain. Skin: Negative for rash. Neurological: Negative for headaches, focal weakness or numbness.  10-point ROS otherwise negative.  ____________________________________________   PHYSICAL EXAM:  VITAL SIGNS: ED Triage Vitals  Enc Vitals Group     BP 06/28/16 2116 132/90     Pulse Rate 06/28/16 2116 (!) 50     Resp 06/28/16 2116 18     Temp 06/28/16 2116 98.6 F (37 C)     Temp Source 06/28/16 2116 Oral     SpO2 06/28/16 2116 99 %     Weight 06/28/16 2113 194 lb (88 kg)     Height 06/28/16 2113 5' (1.524 m)     Head Circumference --      Peak Flow --  Pain Score 06/28/16 2113 9     Pain Loc --      Pain Edu? --      Excl. in GC? --     Constitutional: Alert and oriented. Well appearing and in no acute distress. Eyes: Conjunctivae are normal. PERRL. EOMI. Head: Atraumatic. Nose: No congestion/rhinnorhea. Mouth/Throat: Mucous membranes are moist.  Oropharynx non-erythematous. Neck: No stridor.   Cardiovascular: Normal rate, regular rhythm. Grossly normal heart sounds.  Good peripheral circulation. Respiratory: Normal respiratory effort.  No retractions. Lungs CTAB. Gastrointestinal: Soft and mildly tender to palpation suprapubic pubic area  without rebound or guarding. No distention. No abdominal bruits. No CVA tenderness. Musculoskeletal: No lower extremity tenderness nor edema.  No joint effusions. Neurologic:  Normal speech and language. No gross focal neurologic deficits are appreciated. No gait instability. Skin:  Skin is warm, dry and intact. No rash noted. Psychiatric: Mood and affect are normal. Speech and behavior are normal.  ____________________________________________   LABS (all labs ordered are listed, but only abnormal results are displayed)  Labs Reviewed  COMPREHENSIVE METABOLIC PANEL - Abnormal; Notable for the following:       Result Value   Chloride 99 (*)    Glucose, Bld 100 (*)    All other components within normal limits  CBC - Abnormal; Notable for the following:    WBC 11.9 (*)    All other components within normal limits  LIPASE, BLOOD  TROPONIN I   ____________________________________________  EKG - addendum 07/19/2016  ED ECG REPORT I, Yannick Steuber J, the attending physician, personally viewed and interpreted this ECG.   Date: 06/29/2016  EKG Time: 0534  Rate: 60  Rhythm: normal EKG, normal sinus rhythm  Axis: Normal  Intervals:none  ST&T Change: Nonspecific  ____________________________________________  RADIOLOGY  Pelvic ultrasound interpreted per Dr. Andria Meuse: Normal ultrasound appearance of the uterus. Ovaries are not  visualized.   CT chest/abdomen/pelvis interpreted per Dr. Andria Meuse: No evidence of aneurysm or dissection of the thoracic or abdominal  aorta.    No evidence of active pulmonary disease.    No acute process demonstrated in the abdomen or pelvis.    Right ovarian cyst, likely functional.   ____________________________________________   PROCEDURES  Procedure(s) performed:   Pelvic exam: External exam WNL without rashes, lesions, or vesicles. Speculum exam reveals mild bleeding; cervical os closed. Bimanual exam WNL.  Procedures  Critical Care  performed: No  ____________________________________________   INITIAL IMPRESSION / ASSESSMENT AND PLAN / ED COURSE  Pertinent labs & imaging results that were available during my care of the patient were reviewed by me and considered in my medical decision making (see chart for details).  41 year old female who presents with heavy menstrual period. Reviewed lab work performed at urgent care. Will proceed with pelvic ultrasound; administer IM analgesia.  Clinical Course as of Jun 30 655  Tue Jun 29, 2016  1610 Patient complained of epigastric discomfort after returning from ultrasound. She was given Pepcid with some relief but pain returns again. She is hysterical and writhing around in pain, unable to describe. Will administer analgesia, anxiolytic and, given her dramatic nature, obtain CTA chest/abdomen/pelvis to evaluate aortic catastrophe/dissection. BP 157/121; most likely secondary to pain and dramatic response to pain, but again CTA will be helpful to rule out dissection.  [JS]  [id number] Patient resting comfortably in no acute distress. Updated her of negative CTA and negative troponin. We discussed whether or not to start her on a course of Provera. However, I am leery to  prescribe this as I do not know how she will react when she experiences heavier bleeding.Will refer patient to GYN for outpatient follow-up as needed. Strict return precautions given. Patient verbalizes understanding and agrees with plan of care.  [JS]    Clinical Course User Index [JS] Irean Hong, MD     ____________________________________________   FINAL CLINICAL IMPRESSION(S) / ED DIAGNOSES  Final diagnoses:  Pain  Menorrhagia with regular cycle  Pelvic pain      NEW MEDICATIONS STARTED DURING THIS VISIT:  New Prescriptions   HYDROCODONE-ACETAMINOPHEN (NORCO) 5-325 MG TABLET    Take 1 tablet by mouth every 6 (six) hours as needed for moderate pain.   IBUPROFEN (ADVIL,MOTRIN) 800 MG TABLET    Take 1  tablet (800 mg total) by mouth every 8 (eight) hours as needed for moderate pain.     Note:  This document was prepared using Dragon voice recognition software and may include unintentional dictation errors.    Irean Hong, MD 06/29/16 4098    Irean Hong, MD 07/19/16 [phone]

## 2016-06-29 NOTE — ED Notes (Signed)
Patient transported to Ultrasound 

## 2016-06-29 NOTE — ED Notes (Signed)
Pt complains of upper gastric pain and nausea since morphine was given. MD made aware. See MAR for new orders.

## 2016-07-06 DIAGNOSIS — Z0000 Encounter for general adult medical examination without abnormal findings: Secondary | ICD-10-CM | POA: Diagnosis not present

## 2016-07-06 DIAGNOSIS — R102 Pelvic and perineal pain: Secondary | ICD-10-CM | POA: Diagnosis not present

## 2016-07-22 ENCOUNTER — Ambulatory Visit (INDEPENDENT_AMBULATORY_CARE_PROVIDER_SITE_OTHER): Payer: Self-pay | Admitting: Nurse Practitioner

## 2016-07-22 ENCOUNTER — Encounter: Payer: Self-pay | Admitting: Nurse Practitioner

## 2016-07-22 VITALS — BP 118/82 | HR 78 | Temp 98.4°F | Resp 16 | Ht <= 58 in | Wt 195.2 lb

## 2016-07-22 DIAGNOSIS — Z8742 Personal history of other diseases of the female genital tract: Secondary | ICD-10-CM | POA: Diagnosis not present

## 2016-07-22 DIAGNOSIS — R5383 Other fatigue: Secondary | ICD-10-CM

## 2016-07-22 DIAGNOSIS — Z7689 Persons encountering health services in other specified circumstances: Secondary | ICD-10-CM

## 2016-07-22 DIAGNOSIS — G44201 Tension-type headache, unspecified, intractable: Secondary | ICD-10-CM

## 2016-07-22 DIAGNOSIS — R42 Dizziness and giddiness: Secondary | ICD-10-CM

## 2016-07-22 LAB — FERRITIN: Ferritin: 53 ng/mL (ref 10–154)

## 2016-07-22 LAB — TSH: TSH: 1.8 mIU/L

## 2016-07-22 NOTE — Progress Notes (Signed)
Subjective:    Patient ID: Brittany Pratt, female    DOB: [birth year], 41 y.o.   MRN: [id number]  Brittany DupreGloria Sandra Alfaro [name] is a 41 y.o. female presenting on 07/22/2016 for New Patient (Initial Visit) (Patient here today to establish care, [facility]. Patient [facility] that [facility] due to abnormal heavy bleeding with blood clots. ) Pt speaks spanish and some broken english. Language line available and refused by patient.    HPI  Establish new provider [facility] and [facility].  She had felt they didn't care about her needs and hasn't had labs in over 3 years per pt.  [facility]  LMP with menorrhagia History of menorrhagia and fatigue with last period.  Menses duration 9 days. Pt sought care at the ED for very heavy bleeding with clots.  She seeks care today r/t ongoing fatigue since LMP.  No additional bleeding since 4/16 - next menses due around 5/16.  She has had persistent fatigue, dizziness, and headache.  She has not been recently sexually active, but is in a monogamous relationship with her husband.  Fatigue She states she feels tired all the time since her last menses.  She reports good sleep, but states she possibly gets too much.  She sleeps about 10 hours per night continuously with no daytime naps.  Headaches She has had a headache nearly every day for about 1 year.  She has headache with "a little pain in temples" and more pressure with moderate pain at the occipital region of head and neck.  Occasionally the headaches are associated with blurry vision and a sensation of "large head." She usually does not take medication unless the pain is worse.  Yesterday she had a headache that was worse than usual and took 2 Extra Strength Tylenol once.  Dizziness She has had worsening dizziness over the last 3 weeks. Prior to her menorrhagia she was not dizzy.  Dizziness is worse with sitting  up to get off of bed.  No dizziness when moving from seated to standing. She does not have dizziness with prolonged standing except when bending or turning.    Social History  Substance Use Topics  . Smoking status: Never Smoker  . Smokeless tobacco: Never Used  . Alcohol use No   Family History  Problem Relation Age of Onset  . Cancer Mother   . Diabetes Mother   . Heart disease Son        aortic valve replacement at 39 yo  . Hyperlipidemia Maternal Aunt   . Hypertension Maternal Aunt   . Diabetes Maternal Aunt   . Hypertension Maternal Uncle   . Hyperlipidemia Maternal Uncle   . Diabetes Maternal Uncle    History reviewed. No pertinent past medical history.   Review of Systems  Constitutional: Negative.   HENT: Negative.   Eyes: Negative.   Respiratory: Negative.   Cardiovascular: Negative.   Gastrointestinal: Negative.   Endocrine: Negative.   Genitourinary: Negative.   Musculoskeletal: Positive for arthralgias.       Left knee  Skin: Negative.   Allergic/Immunologic: Negative.   Neurological: Positive for dizziness and headaches.  Hematological: Negative.   Psychiatric/Behavioral: Negative.    Per HPI unless specifically indicated above     Objective:    BP (!) 142/77 (BP Location: Right Arm, Patient Position: Sitting, Cuff Size: Large)   Pulse 78   Temp 98.4 F (36.9  C) (Oral)   Resp 16   Ht 4\' 9"  (1.448 m)   Wt 195 lb 3.2 oz (88.5 kg)   LMP 06/27/2016 (Exact Date) Comment: neg preg test  SpO2 100%   BMI 42.24 kg/m    BP recheck:118/82  Wt Readings from Last 3 Encounters:  07/22/16 195 lb 3.2 oz (88.5 kg)  06/28/16 194 lb (88 kg)  06/28/16 194 lb (88 kg)    Physical Exam  Constitutional: She is oriented to person, place, and time. She appears well-developed and well-nourished.  obese  HENT:  Head: Normocephalic and atraumatic.  Right Ear: Hearing, tympanic membrane, external ear and ear canal normal.  Left Ear: Hearing, tympanic membrane,  external ear and ear canal normal.  Nose: Nose normal.  Mouth/Throat: Oropharynx is clear and moist.  Eyes: Conjunctivae and EOM are normal. Pupils are equal, round, and reactive to light.  Neck: Normal range of motion. Neck supple. No JVD present. No tracheal deviation present.  Cardiovascular: Normal rate, regular rhythm, normal heart sounds and intact distal pulses.   Pulmonary/Chest: Effort normal and breath sounds normal.  Lymphadenopathy:    She has no cervical adenopathy.  Neurological: She is alert and oriented to person, place, and time. She has normal reflexes. No cranial nerve deficit. She exhibits normal muscle tone. Coordination normal.  dix-hallpike maneuver positive bilaterally for elicitation of dizziness with reclining maneuver and sitting up.  Dizziness worst L>R.  Nystagmus also elicited on L  Skin: Skin is warm and dry.  Psychiatric: She has a normal mood and affect. Her behavior is normal. Judgment and thought content normal.  Vitals reviewed.   Results for orders placed or performed in visit on 07/22/16  Ferritin  Result Value Ref Range   Ferritin 53 10 - 154 ng/mL  Iron  Result Value Ref Range   Iron 73 40 - 190 ug/dL  TSH  Result Value Ref Range   TSH 1.80 mIU/L      Assessment & Plan:   Problem List Items Addressed This Visit    None    Visit Diagnoses    Encounter to establish care    -  Primary Obtain records from [facility].  Plan: 1. Annual physical follow up for additional screening and health maintenance.    H/O menorrhagia     No recurrent bleeding. Persistent symptoms of fatigue and dizziness possibly related to episode of heavy bleeding.  CBC during last ED visit normal with normal H/H.  No concern for anemia.  MCH and MCV on low end of normal.  Plan: 1. Rule out iron deficiency as possible but unlikely cause of persistent symptoms. 2. Examine TSH for possible cause of menorrhagia and fatigue.   Relevant Orders   Ferritin (Completed)   Iron  (Completed)   TSH (Completed)   Fatigue, unspecified type     Uncertain cause of fatigue.  Pt with enough, possibly too much sleep.  Plan: 1. Rule out possible, but unlikely iron deficiency. 2. Check TSH for possible contribution to symptoms   Relevant Orders   Ferritin (Completed)   Iron (Completed)   TSH (Completed)   Chronic intractable tension-type headache     Persistent symptoms that are not very worrisome to patient.  Managed with acetaminophen when severe.  Plan: 1. Continue to monitor frequency, severity of headaches. 2. Use acetaminophen and/or  Ibuprofen for abortive therapy. 3. Follow up at next appointment.   Relevant Medications   Acetaminophen (TYLENOL PO)   Dizziness  Probable vertigo with positive dix-hallpike maneuver L>R.  Plan: 1. Encourage adequate hydration. 2. Perform Epley maneuver at home.  Printed instructions provided to patient in Spanish.      Meds ordered this encounter  Medications  . Acetaminophen (TYLENOL PO)    Sig: Take 1,000 mg by mouth as needed.      Follow up plan: Return in about 2 months (around 09/21/2016) for annual physical or in 1-2 weeks as needed for worsening dizziness.   Wilhelmina Mcardle, DNP, AGPCNP-BC Adult Gerontology Primary Care Nurse Practitioner [facility] 07/23/2016, 12:59 PM

## 2016-07-22 NOTE — Patient Instructions (Addendum)
Brittany Pratt, Thank you for coming in to clinic today. Gracias por viene a la officina hoy.  1. For your dizziness: - You likely have vertigo.  Perform exercises as described below. - If these worsen, call the clinic at [phone] and we can meet again or send you to physical therapy or neurology.  2. For your fatigue: - we will check your iron and ferritin.  If these are low, you may need iron supplements. - I am also going to check your thyroid function.  Please schedule a follow-up appointment with Wilhelmina Mcardle, AGNP to No Follow-up on file.  If you have any other questions or concerns, please feel free to call the clinic or send a message through MyChart. You may also schedule an earlier appointment if necessary.  Wilhelmina Mcardle, DNP, AGNP-BC Adult Gerontology Nurse Practitioner [facility], [facility]   Vrtigo posicional benigno (Benign Positional Vertigo) El vrtigo es la sensacin de que usted o todo lo que lo rodea se mueven cuando en realidad eso no sucede. El vrtigo posicional benigno es el tipo de vrtigo ms comn. La causa de este trastorno no es grave (es benigna). Algunos movimientos y determinadas posiciones pueden desencadenar el trastorno (es posicional). El vrtigo posicional benigno puede ser peligroso si ocurre mientras est haciendo algo que podra suponer un riesgo para usted y para los dems, por ejemplo, conduciendo un automvil. CAUSAS En muchos de los [place], se desconoce la causa de este trastorno. Puede deberse a Hotel manager zona del odo interno que ayuda al cerebro a percibir el movimiento y a Administrator, Civil Service equilibrio. Esta alteracin puede deberse a una infeccin viral (laberintitis), a una lesin en la cabeza o a los movimientos reiterados. FACTORES DE RIESGO Es ms probable que esta afeccin se manifieste en:  Las mujeres.  Las [employer] de [id number]. SNTOMAS Generalmente, los sntomas de este trastorno se presentan al mover la  cabeza o los ojos en diferentes direcciones. Pueden aparecer repentinamente y suelen durar menos de un minuto. Entre los sntomas se pueden incluir los siguientes:  Prdida del equilibrio y cadas.  Sensacin de estar dando vueltas o movindose.  Sensacin de que el entorno est dando vueltas o movindose.  Nuseas y vmitos.  Visin borrosa.  Mareos.  Movimientos oculares involuntarios (nistagmo). Los sntomas pueden ser leves y algo fastidiosos, o pueden ser graves e interferir en la vida cotidiana. Los episodios de vrtigo posicional benigno pueden repetirse (ser recurrentes) a lo largo del [place], y algunos movimientos pueden desencadenarlos. Los sntomas pueden mejorar con Museum/gallery conservator. DIAGNSTICO Generalmente, este trastorno se diagnostica con una historia clnica y un examen fsico de la cabeza, el cuello y los odos. Tal vez lo deriven a Catering manager en problemas de la garganta, la nariz y el odo (otorrinolaringlogo), o a uno que se especializa en trastornos del sistema nervioso (neurlogo). Pueden hacerle otros estudios, entre ellos:  Health visitor.  Tomografa computarizada.  Estudios de los [employer]. El mdico puede pedirle que cambie rpidamente de posicin mientras observa si se presentan sntomas de vrtigo posicional benigno, por ejemplo, nistagmo. Los movimientos oculares se pueden estudiar con una electronistagmografa (ENG), con estimulacin trmica, mediante la maniobra de [place] o con la prueba de rotacin.  Electroencefalograma (EEG). Este estudio registra la actividad elctrica del cerebro.  Pruebas de audicin. Lissa Morales, para tratar este trastorno, el mdico le har movimientos especficos con la cabeza para que el odo interno se normalice. Cuando los casos son graves, tal vez haya  que realizar [place], pero esto no es frecuente. En algunos casos, el vrtigo posicional benigno se resuelve por s solo en el  trmino de 2 o 4semanas. INSTRUCCIONES PARA EL CUIDADO EN EL HOGAR Seguridad  Muvase lentamente.No haga movimientos bruscos con el cuerpo o con la cabeza.  No conduzca.  No opere maquinaria pesada.  No haga ninguna tarea que podra ser peligrosa para usted o para Economist en caso de que ocurriera un episodio de vrtigo.  Si tiene dificultad para caminar o mantener el equilibrio, use un bastn para Photographer estabilidad. Si se siente mareado o inestable, sintese de inmediato.  Reanude sus actividades normales como se lo haya indicado el mdico. Pregntele al mdico qu actividades son seguras para usted. Instrucciones generales  [employer] de venta libre y los recetados solamente como se lo haya indicado el mdico.  Evite algunas posiciones o determinados movimientos como se lo haya indicado el mdico.  Beba suficiente lquido para Pharmacologist la orina clara o de color amarillo plido.  Concurra a todas las visitas de control como se lo haya indicado el mdico. Esto es importante. SOLICITE ATENCIN MDICA SI:  Lance Muss.  El trastorno [place], o le aparecen sntomas nuevos.  Sus familiares o amigos advierten cambios en su comportamiento.  Las nuseas o los vmitos empeoran.  Tiene sensacin de hormigueo o de adormecimiento. SOLICITE ATENCIN MDICA DE INMEDIATO SI:  Tiene dificultad para hablar o para moverse.  Esta mareado todo [employer].  Se desmaya.  Tiene dolores de cabeza intensos.  Tiene debilidad en los brazos o las piernas.  Tiene cambios en la audicin o la visin.  Siente rigidez en el cuello.  Tiene sensibilidad a Statistician. Esta informacin no tiene Theme park manager el consejo del mdico. Asegrese de hacerle al mdico cualquier pregunta que tenga. Document Released: 06/17/2008 Document Revised: 11/20/2014 Document Reviewed: 06/24/2014 Elsevier Interactive Patient Education  2017 [employer].   [employer] de Energy manager, cuidado  personal Merchandiser, retail Self-Care) QU ES LA MANIOBRA DE EPLEY? La [employer] de Epley es un ejercicio que puede Education officer, environmental para Paramedic los sntomas del vrtigo posicional paroxstico benigno (VPPB). Esta afeccin a menudo se conoce como vrtigo. El movimiento de unos pequeos cristales (canalculos) dentro del odo interno ocasiona el VPPB. La acumulacin y el movimiento de los canalculos en el odo interno ocasionan una repentina sensacin de aceleracin (vrtigo) cuando se mueve la cabeza en ciertas posiciones. El vrtigo por lo general dura unos 30das. El VPPB normalmente ocurre solo en un odo. Si siente vrtigo cuando se recuesta sobre el lado izquierdo, probablemente tenga VPPB en el odo izquierdo. El mdico le puede decir qu odo est involucrado. Una lesin en la cabeza puede ocasionar el VPPB. Muchas personas de ms de 50aos tienen VPPB por motivos desconocidos. Si se le diagnostic VPPB, el mdico puede ensearle a Firefighter. El VPPB no es potencialmente mortal (benigno) y normalmente se pasa con el tiempo. CUNDO DEBO REALIZAR LA MANIOBRA DE EPLEY? Puede realizar [employer] en su casa cuando tenga los sntomas de vrtigo. Puede realizar Musician de Epley hasta 3veces en un da hasta que los sntomas de vrtigo desaparezcan. CMO DEBO REALIZAR LA MANIOBRA DE EPLEY? 1. Sintese en el borde de una cama o una mesa con la espalda recta. Las piernas deben estar extendidas o colgando sobre el borde de la cama o la mesa. 2. Gire la cabeza a medias hacia el lado del odo afectado. 3. Recustese hacia atrs con  la cabeza girada hasta que se encuentre recostado sobre la espalda. Quizs quiera colocar una almohada debajo de los hombros. 4. Mantenga esta posicin durante 30segundos. Es posible que experimente un ataque de vrtigo. Esto es normal. Mantenga esta posicin hasta que el vrtigo desaparezca. 5. Luego gire la cabeza en direccin opuesta hasta que el odo no afectado  est orientado al suelo. 6. Mantenga esta posicin durante 30segundos. Es posible que experimente un ataque de vrtigo. Esto es normal. Mantenga esta posicin hasta que el vrtigo desaparezca. 7. Ahora gire todo el cuerpo hacia el mismo lado que la cabeza. Mantenga esta posicin durante otros 30segundos. 8. Luego, vuelva a sentarse. ESTA MANIOBRA PRESENTA RIESGOS? En algunos casos, puede tener otros sntomas (como cambios en la visin, debilidad o entumecimiento). Si tiene estos sntomas, deje de Clinical cytogeneticistrealizar la maniobra y llame al mdico. Baron SaneIncluso si realizar esta maniobra lo [place] Fasoalivia del vrtigo, es posible que sienta mareos. El mareo es la sensacin de desvanecimiento pero sin la sensacin de Kensingtonmovimiento. Aunque la Abbott Laboratoriesmaniobra de Energy managerpley lo Kiribatialivie del vrtigo, es posible que los sntomas vuelvan durante los siguientes 5aos. QU DEBO HACER DESPUS DE ESTA MANIOBRA? Puede retomar sus actividades normales despus de Education officer, environmentalrealizar la Clarks[place]maniobra de Energy managerpley. Pregntele al mdico si debe hacer algo en su casa para prevenir el vrtigo. Esta puede incluir:  Dormir con dos o ms almohadas para Pharmacologistmantener la cabeza elevada.  No dormir sobre el lado del odo afectado.  Levantarse lentamente de la cama.  Evitar los movimientos repentinos Administratordurante el da.  Evitar los movimientos de cabeza intensos, como mirar hacia arriba o Public librarianagacharse.  Utilizar un collar cervical para evitar los movimientos de cabeza repentinos. QU DEBO HACER SI LOS SNTOMAS EMPEORAN? Llame al mdico si el vrtigo empeora. Llame al mdico inmediatamente si tiene otros sntomas, incluidos:  Nuseas.  Vmitos.  Dolor de Turkmenistancabeza.  Debilidad.  Entumecimiento.  Cambios en la visin. Esta informacin no tiene Theme park managercomo fin reemplazar el consejo del mdico. Asegrese de hacerle al mdico cualquier pregunta que tenga. Document Released: 03/06/2013 Document Revised: 03/06/2013 Document Reviewed: 01/23/2013 Elsevier Interactive Patient Education  2017  Arvin[employer]itorElsevier Inc.

## 2016-07-23 ENCOUNTER — Encounter: Payer: Self-pay | Admitting: Nurse Practitioner

## 2016-07-23 ENCOUNTER — Other Ambulatory Visit: Payer: Self-pay | Admitting: Nurse Practitioner

## 2016-07-23 ENCOUNTER — Telehealth: Payer: Self-pay

## 2016-07-23 DIAGNOSIS — Z6841 Body Mass Index (BMI) 40.0 and over, adult: Secondary | ICD-10-CM

## 2016-07-23 DIAGNOSIS — Z0000 Encounter for general adult medical examination without abnormal findings: Secondary | ICD-10-CM

## 2016-07-23 LAB — IRON: Iron: 73 ug/dL (ref 40–190)

## 2016-07-23 NOTE — Progress Notes (Signed)
I have reviewed this encounter including the documentation in this note and/or discussed this patient with the provider, Wilhelmina McardleLauren Kennedy, AGPCNP-BC. I am certifying that I agree with the content of this note as supervising physician.  Saralyn PilarAlexander Karamalegos, DO [facility] 07/23/2016, 11:26 PM

## 2016-07-23 NOTE — Telephone Encounter (Signed)
Patient advised as below. Patient verbalizes understanding and is in agreement with treatment plan. Patient scheduled for cpe on 09/16/16 does she need to come in sooner for lab? Patient reports that she was told her cholesterol was going to be checked. Please advise.

## 2016-07-23 NOTE — Telephone Encounter (Signed)
She can come 2-3 days before her appointment on 7/2 or 7/3 for fasting labs.

## 2016-07-23 NOTE — Telephone Encounter (Signed)
lm

## 2016-07-23 NOTE — Telephone Encounter (Signed)
-----   Message from Galen ManilaLauren Renee Kennedy, NP sent at 07/23/2016  8:00 AM EDT ----- Ferritin and iron are normal.  TSH is also normal.  We can consider other reasons for fatigue in the future.  For now, work to get at least 8 hours of sleep per night.  Exercise 30 minutes most days of the week.  Continue to do your exercises to help with your vertigo and stay hydrated.  For your headaches, it is ok to take 1000 mg acetaminophen (2 tablets Extra Strength Tylenol) every 8 hours.  You can also take 400-600 mg ibuprofen (2-3 tablets Motrin or Advil) every 8 hours.  Monitor the number of days between periods, severity of bleeding, and duration of bleeding for your next period.  If your flow continues to be heavy, I can send a referral for you to Gynecology.

## 2016-08-06 IMAGING — CT CT ABD-PELV W/ CM
1 of 2 series · 15 of 32 positions shown, 19 images · IV contrast (omnipaque)
Comparison: 06/21/2009

CLINICAL DATA: Intermittent mid back pain for 1 week, radiating
into the upper abdomen. Worsened over the past 2 days.

EXAM:
CT ABDOMEN AND PELVIS WITH CONTRAST
TECHNIQUE: Multidetector CT imaging of the abdomen and pelvis was performed
using the standard protocol following bolus administration of
intravenous contrast.
CONTRAST:  100mL OMNIPAQUE IOHEXOL 300 MG/ML  SOLN

[Series 3: routine abd pel with · axial · 0.78mm/px · z∈[-1000,-586]mm · 15 of 91 slices shown, 19 images]
[im 4/91  soft-tissue]
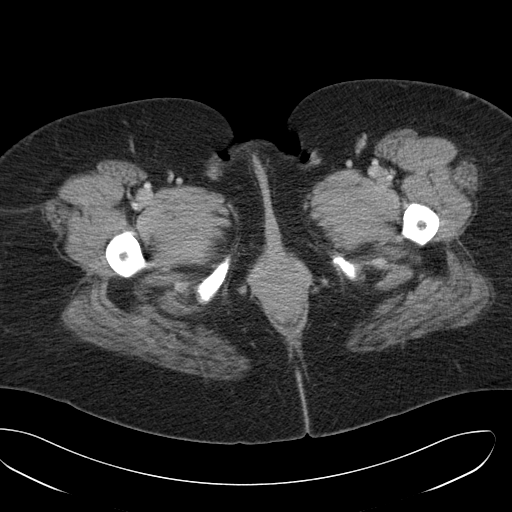
[im 4/91  bone]
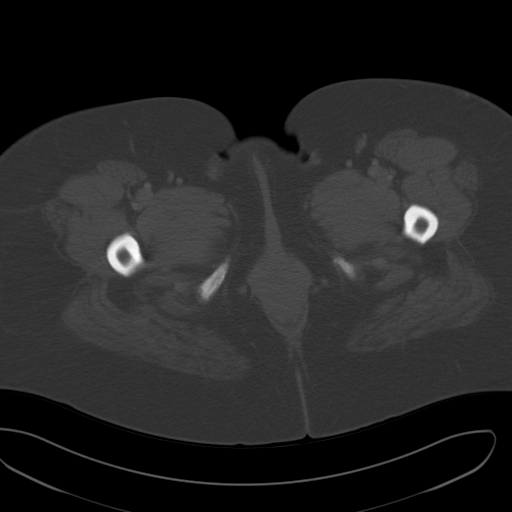
[im 12/91  soft-tissue]
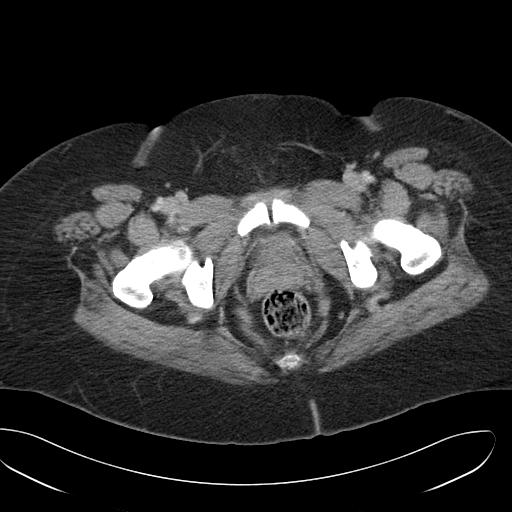
[im 20/91  soft-tissue]
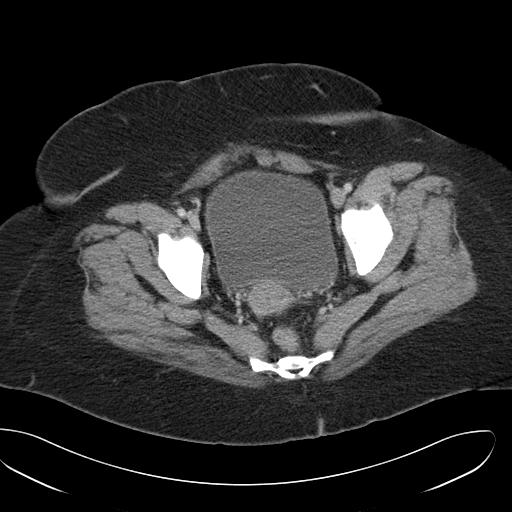
[im 24/91  soft-tissue]
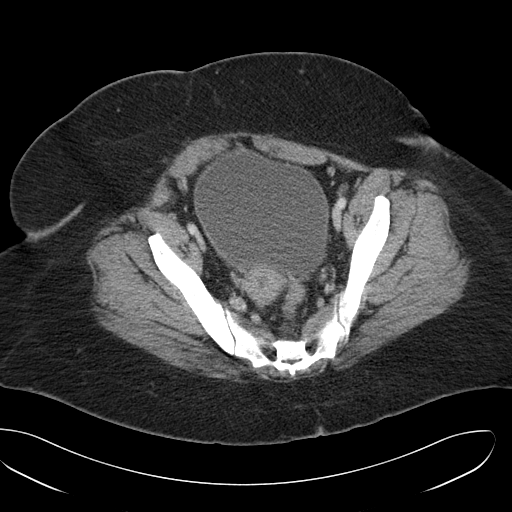
[im 32/91  soft-tissue]
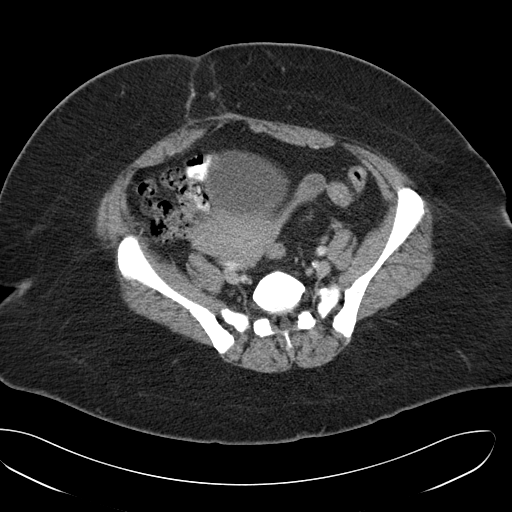
[im 40/91  soft-tissue]
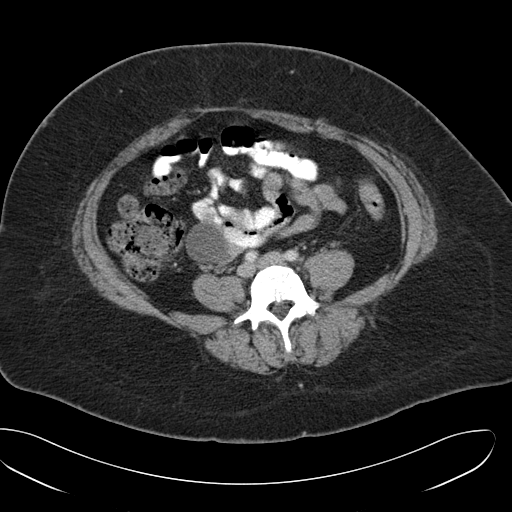
[im 47/91  soft-tissue]
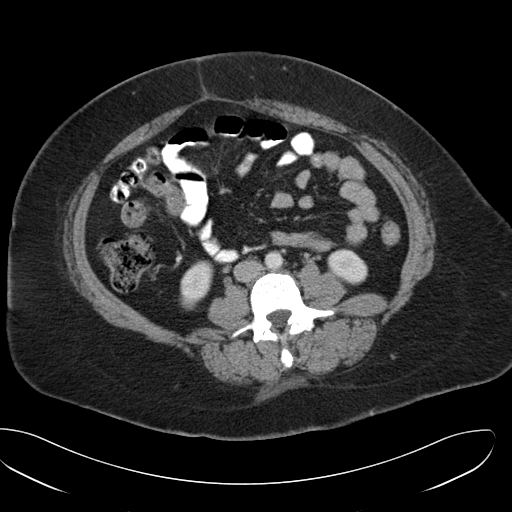
[im 51/91  soft-tissue]
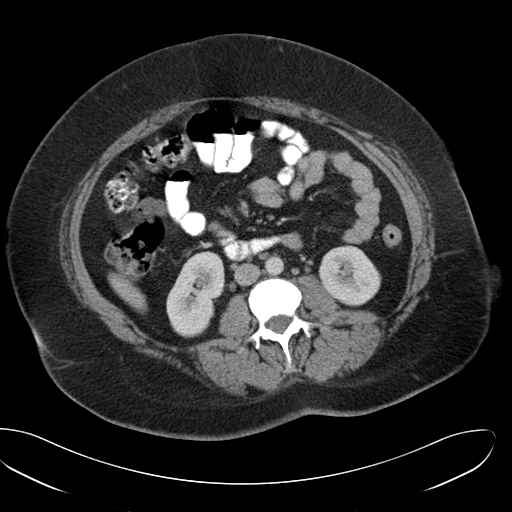
[im 59/91  soft-tissue]
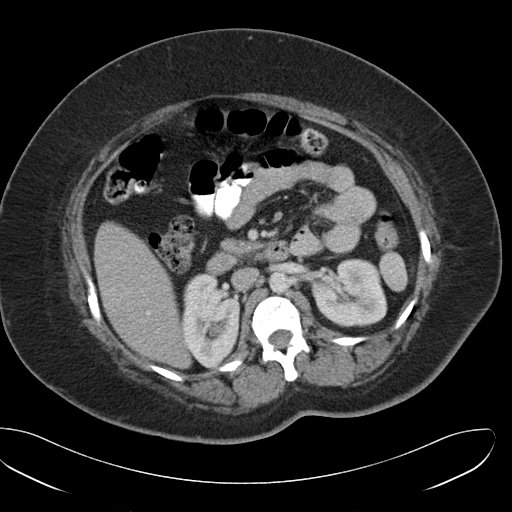
[im 59/91  bone]
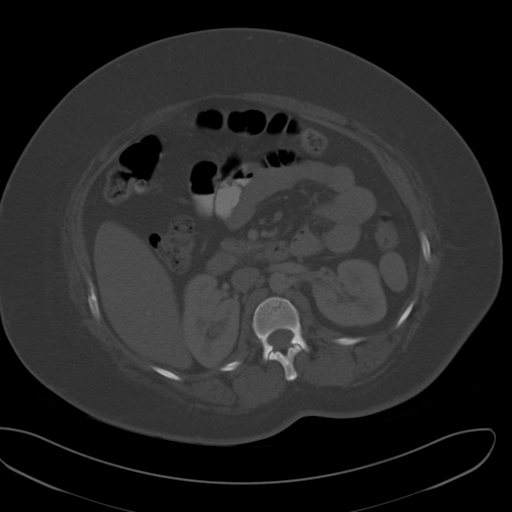
[im 67/91  soft-tissue]
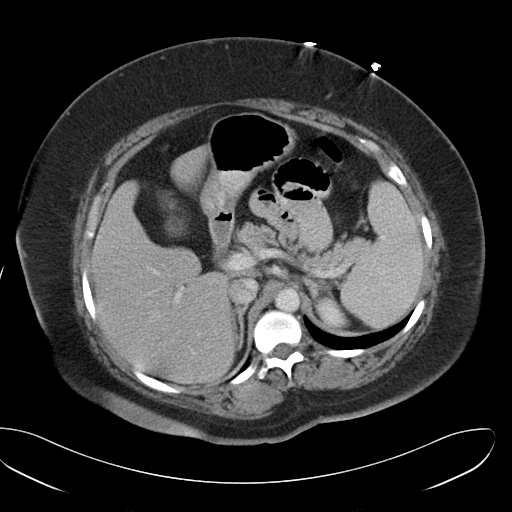
[im 71/91  soft-tissue]
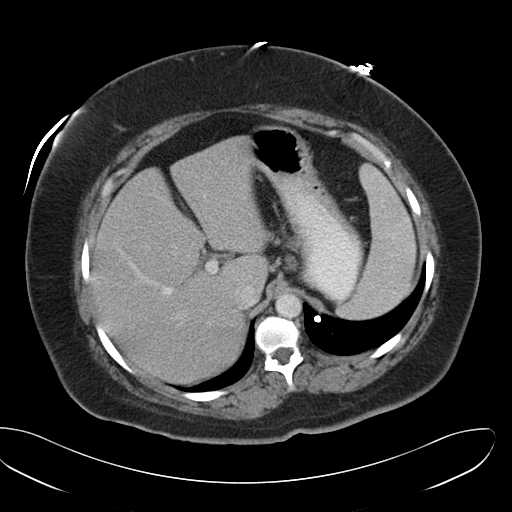
[im 75/91  lung]
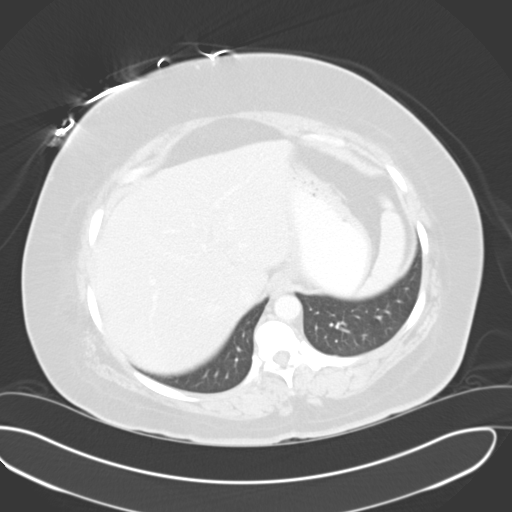
[im 79/91  soft-tissue]
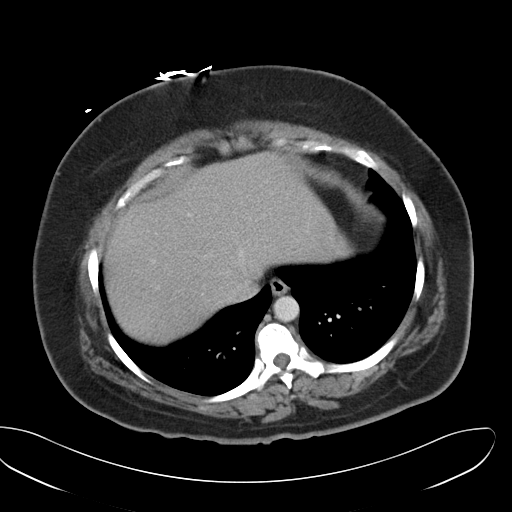
[im 79/91  lung]
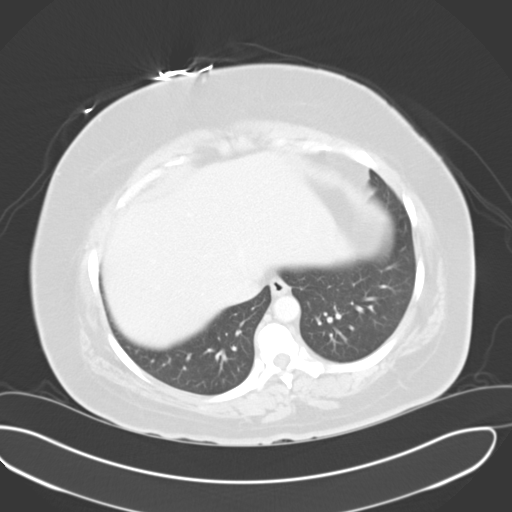
[im 83/91  lung]
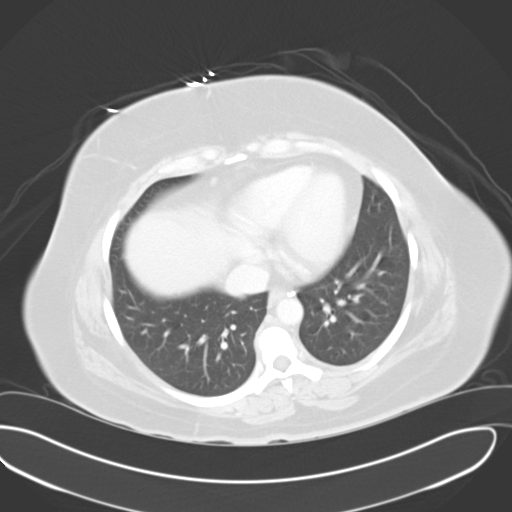
[im 87/91  soft-tissue]
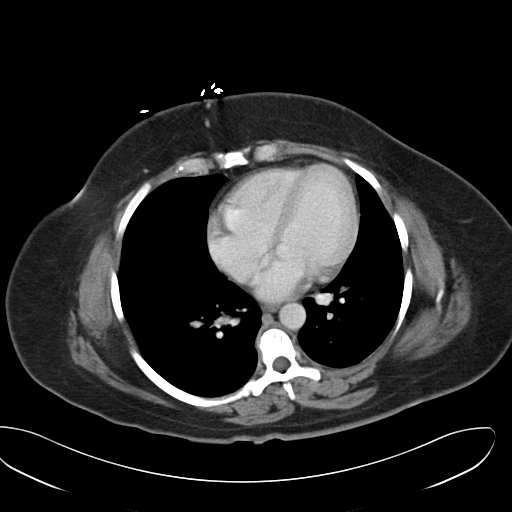
[im 87/91  lung]
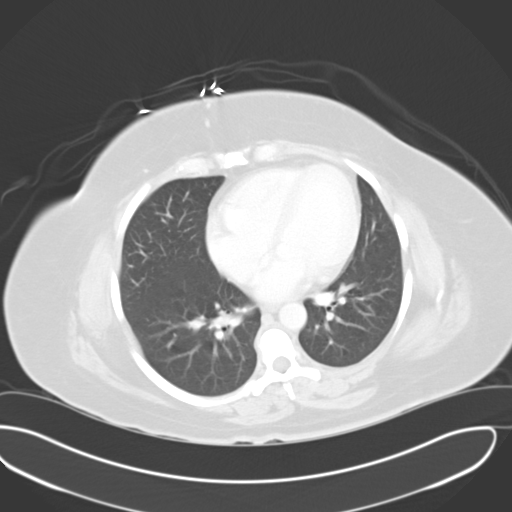

[15 of 32 positions shown; findings below may reference images not displayed]

FINDINGS: Lower chest:  No significant abnormality

Hepatobiliary: There is cholecystectomy. The liver and bile ducts
appear unremarkable.

Pancreas: Normal

Spleen: Normal

Adrenals/Urinary Tract: The adrenals and kidneys are normal in
appearance. There is no urinary calculus evident. There is no
hydronephrosis or ureteral dilatation. Collecting systems and
ureters appear unremarkable.

Stomach/Bowel: The stomach and small bowel appear unremarkable.
Colon is unremarkable.

Vascular/Lymphatic: The abdominal aorta is normal in caliber. There
is no atherosclerotic calcification. There is no adenopathy in the
abdomen or pelvis.

Reproductive: There is a 3 cm right ovarian cyst, probably
physiologic. Uterus and left ovary are unremarkable.

Other: No acute inflammatory changes are evident in the abdomen or
pelvis. There is no ascites.

Musculoskeletal: No significant abnormality.
IMPRESSION: 3 cm right ovarian cyst.  Otherwise unremarkable.

## 2016-09-13 ENCOUNTER — Other Ambulatory Visit: Payer: BLUE CROSS/BLUE SHIELD

## 2016-09-13 DIAGNOSIS — Z6841 Body Mass Index (BMI) 40.0 and over, adult: Secondary | ICD-10-CM

## 2016-09-13 DIAGNOSIS — Z0000 Encounter for general adult medical examination without abnormal findings: Secondary | ICD-10-CM | POA: Diagnosis not present

## 2016-09-14 LAB — LIPID PANEL
Cholesterol: 168 mg/dL (ref <200)
HDL: 57 mg/dL (ref >50)
LDL Cholesterol: 100 mg/dL — ABNORMAL HIGH (ref <100)
Total CHOL/HDL Ratio: 2.9 Ratio (ref <5.0)
Triglycerides: 56 mg/dL (ref <150)
VLDL: 11 mg/dL (ref <30)

## 2016-09-14 LAB — HEMOGLOBIN A1C
Hgb A1c MFr Bld: 5.4 % (ref <5.7)
Mean Plasma Glucose: 108 mg/dL

## 2016-09-16 ENCOUNTER — Ambulatory Visit (INDEPENDENT_AMBULATORY_CARE_PROVIDER_SITE_OTHER): Payer: BLUE CROSS/BLUE SHIELD | Admitting: Nurse Practitioner

## 2016-09-16 ENCOUNTER — Encounter: Payer: Self-pay | Admitting: Nurse Practitioner

## 2016-09-16 ENCOUNTER — Other Ambulatory Visit: Payer: Self-pay | Admitting: Nurse Practitioner

## 2016-09-16 VITALS — BP 128/72 | HR 71 | Temp 98.4°F | Resp 16 | Ht <= 58 in | Wt 190.0 lb

## 2016-09-16 DIAGNOSIS — Z0000 Encounter for general adult medical examination without abnormal findings: Secondary | ICD-10-CM | POA: Diagnosis not present

## 2016-09-16 DIAGNOSIS — Z23 Encounter for immunization: Secondary | ICD-10-CM | POA: Diagnosis not present

## 2016-09-16 DIAGNOSIS — Z124 Encounter for screening for malignant neoplasm of cervix: Secondary | ICD-10-CM | POA: Diagnosis not present

## 2016-09-16 NOTE — Patient Instructions (Addendum)
Brittany Pratt, Gracias.  Thank you for coming in to clinic today.  1. For your itchy skin, use hydrocortisone cream.  Protect your skin with hat, long sleeves, and long pants.  Use sunscreen and try to avoid the sun.   You are having an allergic reaction to the sun and it will go away when you have not been in the sun.  2. Obtain an eye doctor for your vision.    3. You received your tdap vaccine today.  This will prevent tetanus and pertussis.  Let us know if you have any questions (si tiene preguntas, di nos).  Please schedule a follow-up appointment with Brittany Pratt, AGNP to Return in about 1 year (around 09/16/2017) for annual physical. Come sooner if needed for illnesses.  If you have any other questions or concerns, please feel free to call the clinic or send a message through MyChart. You may also schedule an earlier appointment if necessary.  Brittany McardleLauren Taite Baldassari, DNP, AGNP-BC Adult Gerontology Nurse Practitioner [facility], [facility]

## 2016-09-16 NOTE — Progress Notes (Signed)
Subjective:    Patient ID: Brittany Pratt, female    DOB: [birth year], 41 y.o.   MRN: [id number]  Brittany Pratt is a 41 y.o. female presenting on 09/16/2016 for Annual Exam  Telephone Spanish Interpreter utilized during visit: Tobi Bastosnna w/ ID# [id number]  HPI Patient has been feeling well.  They have no acute concerns today. Sleeps 7-8 hours per night uninterrupted most nights.  When interrupted, is able to return to sleep easily.  Health Maintenance Weight/BMI: down 5 lbs Physical activity: 5 days 30 minutes. Diet: more vegetables, less fat, and water.  No sugary beverages. Seatbelt: always Sunscreen: regularly PAP: 3 years ago Mammogram: none needed yet.  No family history of breast cancer. Tetanus: unknown  Sexual history: monogamous relationship w/ husband.  Unsure of whether relationship is mutually monogamous status.    Past Medical History:  Diagnosis Date  . Healthy adult on routine physical examination    Past Surgical History:  Procedure Laterality Date  . APPENDECTOMY    . CESAREAN SECTION     x 3  . CHOLECYSTECTOMY    . KNEE ARTHROSCOPY     Social History   Social History  . Marital status: Divorced    Spouse name: N/A  . Number of children: N/A  . Years of education: N/A   Occupational History  . Not on file.   Social History Main Topics  . Smoking status: Never Smoker  . Smokeless tobacco: Never Used  . Alcohol use No  . Drug use: No  . Sexual activity: Not Currently   Other Topics Concern  . Not on file   Social History Narrative  . No narrative on file   Family History  Problem Relation Age of Onset  . Cancer Mother   . Diabetes Mother   . Heart disease Son        aortic valve replacement at 314 yo  . Hyperlipidemia Maternal Aunt   . Hypertension Maternal Aunt   . Diabetes Maternal Aunt   . Hypertension Maternal Uncle   . Hyperlipidemia Maternal Uncle   . Diabetes Maternal Uncle    Current Outpatient  Prescriptions on File Prior to Visit  Medication Sig  . Acetaminophen (TYLENOL PO) Take 1,000 mg by mouth as needed.   No current facility-administered medications on file prior to visit.     Review of Systems  Constitutional: Negative.   HENT: Negative.   Eyes: Positive for visual disturbance.       Poor vision especially for reading  Respiratory: Negative.   Cardiovascular: Negative.   Gastrointestinal: Negative.   Endocrine: Negative.   Genitourinary: Negative.   Musculoskeletal: Positive for arthralgias.       Right knee pain  Skin: Negative.   Allergic/Immunologic: Negative.   Neurological: Negative.   Hematological: Negative.   Psychiatric/Behavioral: Negative.    Per HPI unless specifically indicated above      Objective:    BP 128/72   Pulse 71   Temp 98.4 F (36.9 C) (Oral)   Resp 16   Ht 4\' 9"  (1.448 m)   Wt 190 lb (86.2 kg)   BMI 41.12 kg/m    Wt Readings from Last 3 Encounters:  09/16/16 190 lb (86.2 kg)  07/22/16 195 lb 3.2 oz (88.5 kg)  06/28/16 194 lb (88 kg)    Physical Exam General - healthy, well-appearing, NAD HEENT - Normocephalic, atraumatic, PERRL, EOMI, patent nares w/o congestion, oropharynx clear, MMM Neck - supple, non-tender, no LAD,  no thyromegaly, negative carotid bruit Heart - RRR, bradycardia, no murmurs heard Lungs - Clear throughout all lobes, no wheezing, crackles, or rhonchi. Normal work of breathing. Abdomen - soft, NTND, no masses, no hepatosplenomegaly, active bowel sounds GU - Normal external female genitalia without lesions or fusion. Vaginal canal without lesions. Normal appearing cervix without lesions or friability. Physiologic discharge on exam. Bimanual exam without adnexal masses, enlarged uterus, or cervical motion tenderness. Extremeties - non-tender, no edema, cap refill < 2 seconds, peripheral pulses intact +2 bilaterally Skin - warm, dry, no rashes Neuro - awake, alert, oriented x3, CN II-X intact, intact  muscle strength 5/5 bilaterally, intact distal sensation to light touch, normal coordination, normal gait Psych - Normal mood and affect, normal behavior    Results for orders placed or performed in visit on 09/13/16  Lipid panel  Result Value Ref Range   Cholesterol 168 <200 mg/dL   Triglycerides 56 <244 mg/dL   HDL 57 >01 mg/dL   Total CHOL/HDL Ratio 2.9 <5.0 Ratio   VLDL 11 <30 mg/dL   LDL Cholesterol 027 (H) <100 mg/dL  Hemoglobin O5D  Result Value Ref Range   Hgb A1c MFr Bld 5.4 <5.7 %   Mean Plasma Glucose 108 mg/dL      Assessment & Plan:   Problem List Items Addressed This Visit    None    Visit Diagnoses    Encounter for annual physical exam    -  Primary Physical exam with no new findings.  Well adult with no acute concerns.  Plan: 1. Obtain health maintenance screenings.  Reviewed labs and results in clinic today.  No treatment necessary at this time. 2. Return 1 year for annual physical.    Pap smear for cervical cancer screening     Normal vaginal exam and no findings on exam suspicious for abnormalitiy.  Plan: 1. Send PAP w/ HPV testing.   Relevant Orders   PAP, Thin Prep w/HPV rflx HPV Type 16/18   Need for Tdap vaccination     Pt w/ unknown status of Tdap vaccine.  Provides consent for vaccination today.  Plan: 1. VIS provided in Spanish to patient. 2. Administer vaccine.   Relevant Orders   Tdap vaccine greater than or equal to 7yo IM (Completed)        Follow up plan: Return in about 1 year (around 09/16/2017) for annual physical.  Wilhelmina Mcardle, DNP, AGPCNP-BC Adult Gerontology Primary Care Nurse Practitioner [facility] 09/19/2016, 10:38 PM

## 2016-09-20 LAB — PAP, THIN PREP W/HPV RFLX HPV TYPE 16/18: HPV DNA High Risk: NOT DETECTED

## 2016-09-20 NOTE — Progress Notes (Signed)
I have reviewed this encounter including the documentation in this note and/or discussed this patient with the provider, Wilhelmina McardleLauren Kennedy, AGPCNP-BC. I am certifying that I agree with the content of this note as supervising physician.  Saralyn PilarAlexander Kleigh Hoelzer, DO [facility] 09/20/2016, 5:19 PM

## 2016-09-23 ENCOUNTER — Encounter: Payer: BLUE CROSS/BLUE SHIELD | Admitting: Nurse Practitioner

## 2017-04-18 ENCOUNTER — Ambulatory Visit
Admission: EM | Admit: 2017-04-18 | Discharge: 2017-04-18 | Disposition: A | Discharge status: Home / Self Care | Payer: BLUE CROSS/BLUE SHIELD | Attending: Family Medicine | Admitting: Family Medicine

## 2017-04-18 ENCOUNTER — Encounter: Payer: Self-pay | Admitting: *Deleted

## 2017-04-18 DIAGNOSIS — R112 Nausea with vomiting, unspecified: Secondary | ICD-10-CM | POA: Diagnosis not present

## 2017-04-18 DIAGNOSIS — R69 Illness, unspecified: Secondary | ICD-10-CM

## 2017-04-18 DIAGNOSIS — J111 Influenza due to unidentified influenza virus with other respiratory manifestations: Secondary | ICD-10-CM

## 2017-04-18 LAB — RAPID STREP SCREEN (MED CTR MEBANE ONLY): Streptococcus, Group A Screen (Direct): NEGATIVE

## 2017-04-18 MED ORDER — ACETAMINOPHEN 325 MG PO TABS
650.0000 mg | ORAL_TABLET | Freq: Once | ORAL | Status: AC
  Administered 2017-04-18: 650 mg via ORAL

## 2017-04-18 MED ORDER — ONDANSETRON 4 MG PO TBDP: 4.0000 mg | ORAL_TABLET | Freq: Three times a day (TID) | ORAL | 0 refills | Status: DC | PRN

## 2017-04-18 MED ORDER — ONDANSETRON 8 MG PO TBDP
8.0000 mg | ORAL_TABLET | Freq: Once | ORAL | Status: AC
  Administered 2017-04-18: 8 mg via ORAL

## 2017-04-18 MED ORDER — OSELTAMIVIR PHOSPHATE 75 MG PO CAPS: 75.0000 mg | ORAL_CAPSULE | Freq: Two times a day (BID) | ORAL | 0 refills | Status: DC

## 2017-04-18 NOTE — ED Triage Notes (Signed)
Nausea, headaches, chills, sore throat, body aches, x3 days.

## 2017-04-18 NOTE — ED Provider Notes (Signed)
[facility] ____________________________________________  Time seen: Approximately 1234 PM  I have reviewed the triage vital signs and the nursing notes.   HISTORY  Chief Complaint Nausea; Headache; Sore Throat; Chills; and Generalized Body Aches   HPI Brittany Pratt is a 42 y.o. female presenting for evaluation of chills, body aches, warm sensation and runny nose that is been present since Saturday afternoon.  Reports last night into today 6 episodes of vomiting with continued nausea and abdominal discomfort.  No diarrhea.  No cough or sore throat.  Reports overall continues to drink fluids well today.  States did not measure temperature, but felt like she had a fever has taken some intermittent over-the-counter Tylenol.  Reports 2 of her young sons have been diagnosed with influenza in the last week with one being diagnosed and testing positive this past Saturday, same day of her sickness onset.  No recent sickness for her.  Denies dysuria, chest pain, shortness of breath, extremity swelling, rash.  Galen ManilaKennedy, Lauren Renee, NP: PCP LMP: 3 weeks ago. Denies pregnancy.     Past Medical History:  Diagnosis Date  . Healthy adult on routine physical examination     Patient Active Problem List   Diagnosis Date Noted  . BMI 40.0-44.9, adult (HCC) 07/23/2016    Past Surgical History:  Procedure Laterality Date  . APPENDECTOMY    . CESAREAN SECTION     x 3  . CHOLECYSTECTOMY    . KNEE ARTHROSCOPY       No current facility-administered medications for this encounter.   Current Outpatient Medications:  .  Acetaminophen (TYLENOL PO), Take 1,000 mg by mouth as needed., Disp: , Rfl:  .  ondansetron (ZOFRAN ODT) 4 MG disintegrating tablet, Take 1 tablet (4 mg total) by mouth every 8 (eight) hours as needed., Disp: 15 tablet, Rfl: 0 .  oseltamivir (TAMIFLU) 75 MG capsule, Take 1 capsule (75 mg total) by mouth every 12 (twelve) hours., Disp: 10 capsule, Rfl:  0  Allergies Patient has no known allergies.  Family History  Problem Relation Age of Onset  . Cancer Mother   . Diabetes Mother   . Heart disease Son        aortic valve replacement at 40 yo  . Hyperlipidemia Maternal Aunt   . Hypertension Maternal Aunt   . Diabetes Maternal Aunt   . Hypertension Maternal Uncle   . Hyperlipidemia Maternal Uncle   . Diabetes Maternal Uncle     Social History Social History   Tobacco Use  . Smoking status: Never Smoker  . Smokeless tobacco: Never Used  Substance Use Topics  . Alcohol use: No  . Drug use: No    Review of Systems Constitutional: As above.  ENT: No sore throat. Cardiovascular: Denies chest pain. Respiratory: Denies shortness of breath. Gastrointestinal: As above.  Genitourinary: Negative for dysuria. Musculoskeletal: Negative for back pain. Skin: Negative for rash. Neurological: Negative for focal weakness or numbness.   ____________________________________________   PHYSICAL EXAM:  VITAL SIGNS: ED Triage Vitals  Enc Vitals Group     BP 04/18/17 1139 124/80     Pulse Rate 04/18/17 1139 (!) 109     Resp 04/18/17 1139 16     Temp 04/18/17 1139 98.9 F (37.2 C)     Temp Source 04/18/17 1139 Oral     SpO2 04/18/17 1139 100 %     Weight 04/18/17 1141 185 lb (83.9 kg)     Height 04/18/17 1141 5' (1.524 m)  Head Circumference --      Peak Flow --      Pain Score --      Pain Loc --      Pain Edu? --      Excl. in GC? --     Constitutional: Alert and oriented. Well appearing and in no acute distress. Eyes: Conjunctivae are normal.  Head: Atraumatic. No sinus tenderness to palpation. No swelling. No erythema.  Ears: no erythema, normal TMs bilaterally.   Nose:Nasal congestion   Mouth/Throat: Mucous membranes are moist. No pharyngeal erythema. No tonsillar swelling or exudate.  Neck: No stridor.  No cervical spine tenderness to palpation. Hematological/Lymphatic/Immunilogical: No cervical  lymphadenopathy. Cardiovascular: Normal rate, regular rhythm. Grossly normal heart sounds.  Good peripheral circulation. Respiratory: Normal respiratory effort.  No retractions. No wheezes, rales or rhonchi. Good air movement.  Gastrointestinal: Normal Bowel sounds.  Obese abdomen.  Mild diffuse tenderness, no point tenderness, non-guarding. Musculoskeletal: Ambulatory with steady gait.  Neurologic:  Normal speech and language. No gait instability. Skin:  Skin appears warm, dry and intact. No rash noted. Psychiatric: Mood and affect are normal. Speech and behavior are normal.  ___________________________________________   LABS (all labs ordered are listed, but only abnormal results are displayed)  Labs Reviewed  RAPID STREP SCREEN (NOT AT [facility])  CULTURE, GROUP A STREP [facility])    PROCEDURES Procedures   INITIAL IMPRESSION / ASSESSMENT AND PLAN / ED COURSE  Pertinent labs & imaging results that were available during my care of the patient were reviewed by me and considered in my medical decision making (see chart for details).  Overall well-appearing patient.  Patient with direct sick contacts at home positive for influenza.  Suspect influenza versus gastrointestinal virus.  Tolerating fluids in room.  With positive sick contacts, discussed treatment with Tamiflu, patient agrees.  Single dose of 8 mg ODT Zofran and 650 mg Tylenol given once in urgent care.  Will treat with as needed Zofran and Tamiflu at home.  Encouraged rest, fluids, supportive care, over-the-counter Tylenol ibuprofen or congestion medication as needed.  Work note given for today and tomorrow. Discussed indication, risks and benefits of medications with patient.  Discussed follow up with Primary care physician this week. Discussed follow up and return parameters including no resolution or any worsening concerns. Patient verbalized understanding and agreed to plan.    ____________________________________________   FINAL CLINICAL IMPRESSION(S) / ED DIAGNOSES  Final diagnoses:  Influenza-like illness  Non-intractable vomiting with nausea, unspecified vomiting type     ED Discharge Orders        Ordered    oseltamivir (TAMIFLU) 75 MG capsule  Every 12 hours     04/18/17 1248    ondansetron (ZOFRAN ODT) 4 MG disintegrating tablet  Every 8 hours PRN     04/18/17 1248       Note: This dictation was prepared with Dragon dictation along with smaller phrase technology. Any transcriptional errors that result from this process are unintentional.         Renford Dills, NP 04/18/17 1325

## 2017-04-18 NOTE — Discharge Instructions (Signed)
Take medication as prescribed. Rest. Drink plenty of fluids.  ° °Follow up with your primary care physician this week as needed. Return to Urgent care for new or worsening concerns.  ° °

## 2017-04-21 LAB — CULTURE, GROUP A STREP (THRC)

## 2017-12-10 IMAGING — US US PELVIS COMPLETE
1 series · 14 of 25 positions shown · non-contrast
Comparison: CT abdomen and pelvis 11/11/2014

CLINICAL DATA: Heavy vaginal bleeding for 2 days.

EXAM:
TRANSABDOMINAL AND TRANSVAGINAL ULTRASOUND OF PELVIS
TECHNIQUE: Both transabdominal and transvaginal ultrasound examinations of the
pelvis were performed. Transabdominal technique was performed for
global imaging of the pelvis including uterus, ovaries, adnexal
regions, and pelvic cul-de-sac. It was necessary to proceed with
endovaginal exam following the transabdominal exam to visualize the
endometrium and ovaries.

[Series 1: us pelvis complete · 0.22mm/px · 14 of 61 slices shown]
[im 1/61]
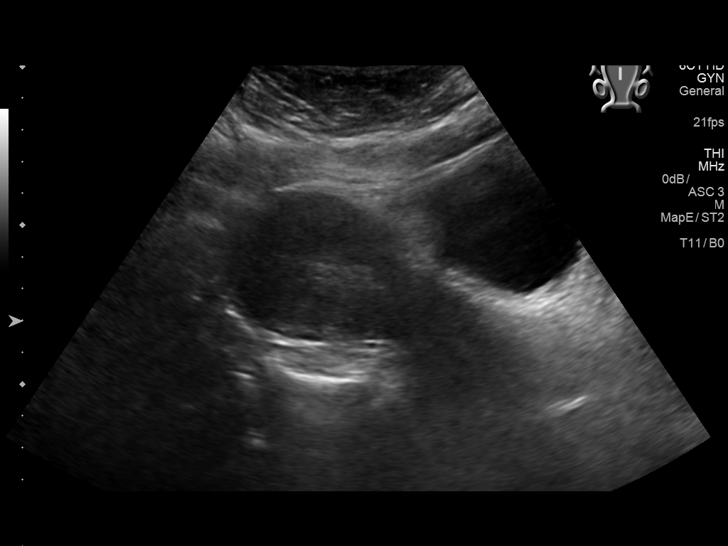
[im 6/61]
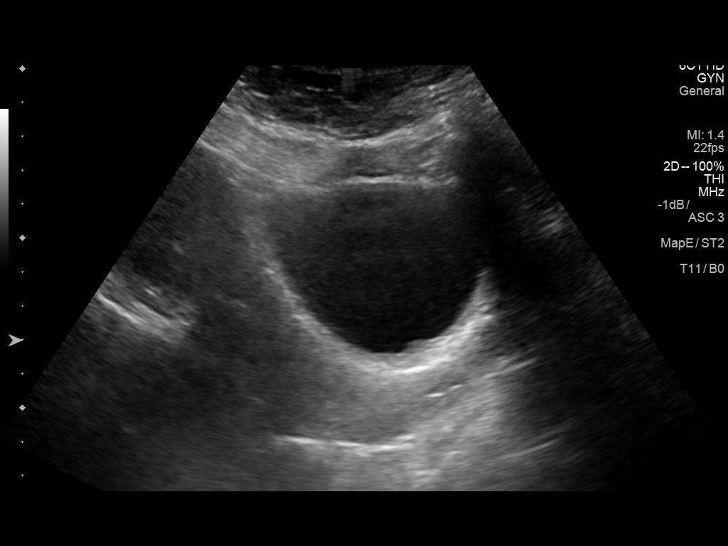
[im 11/61]
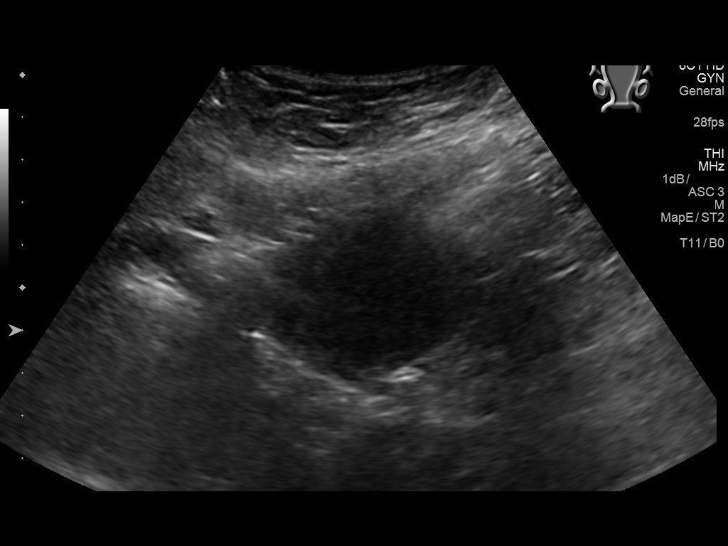
[im 16/61]
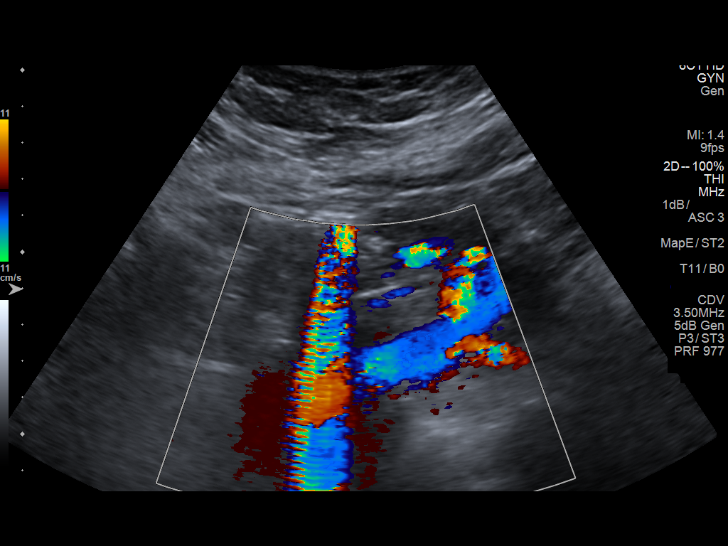
[im 21/61]
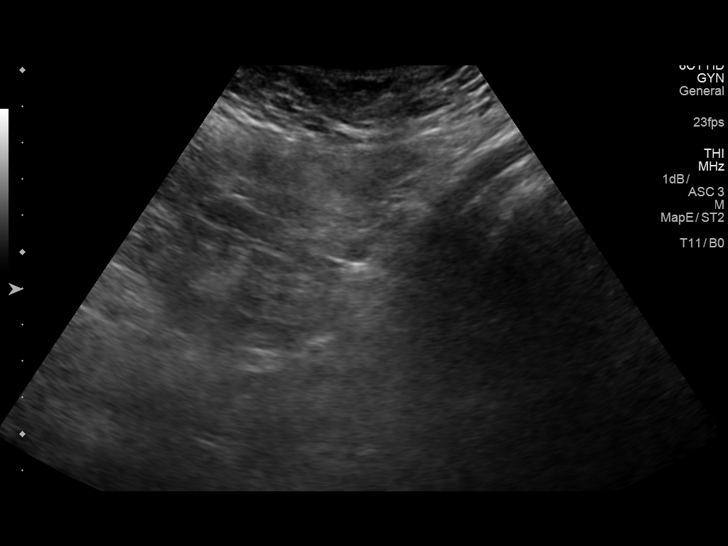
[im 23/61]
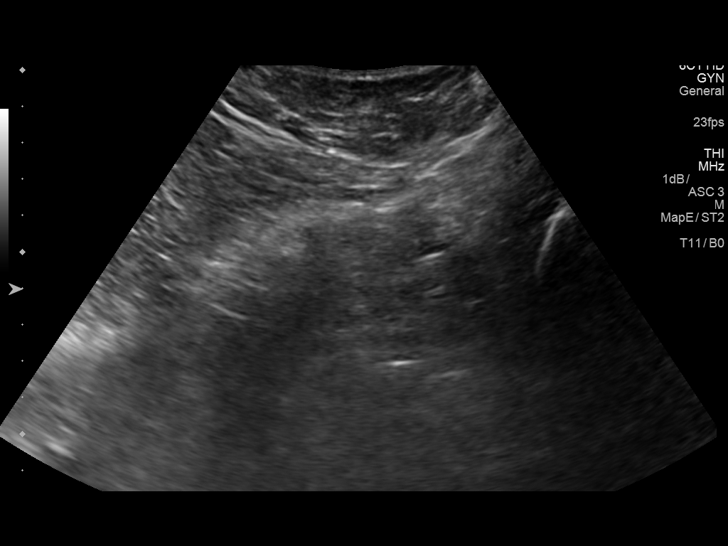
[im 28/61]
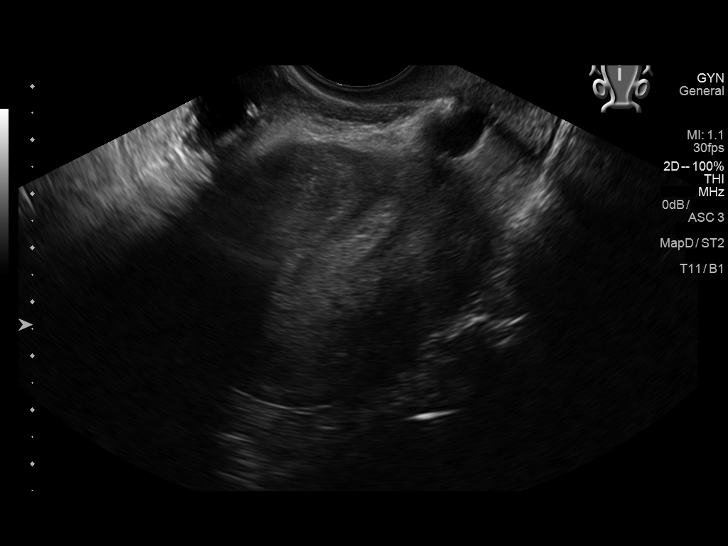
[im 33/61]
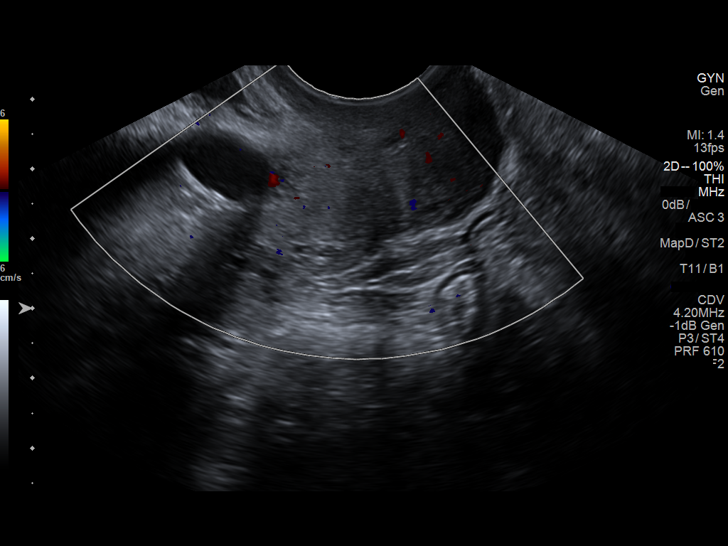
[im 38/61]
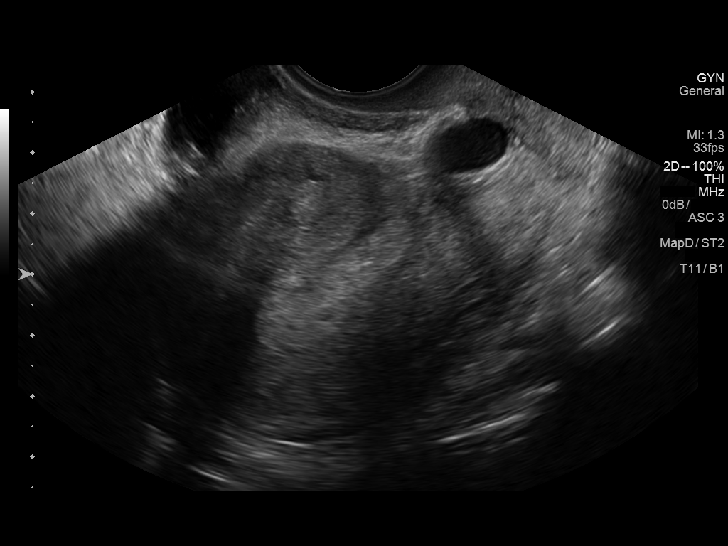
[im 41/61]
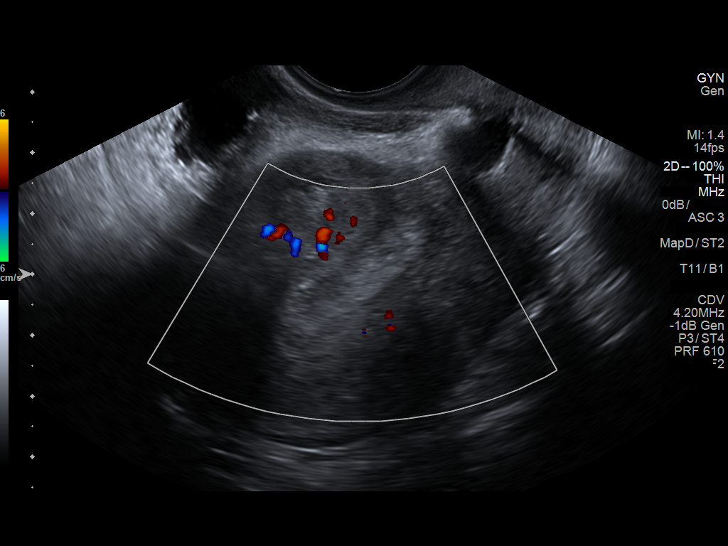
[im 46/61]
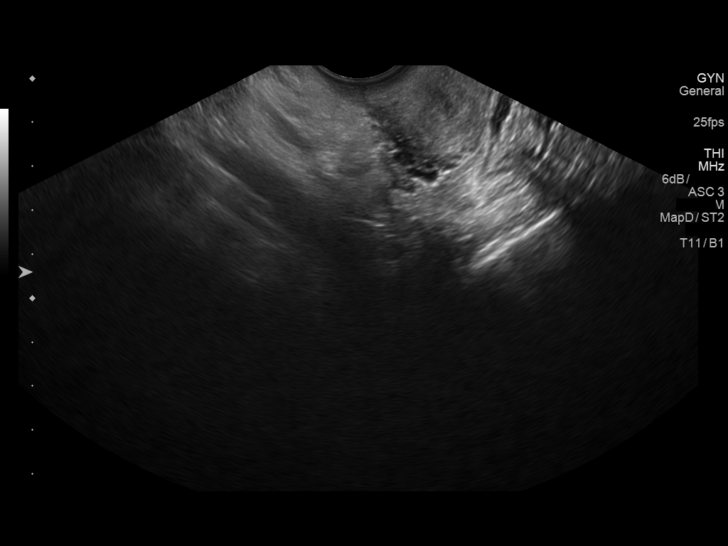
[im 51/61]
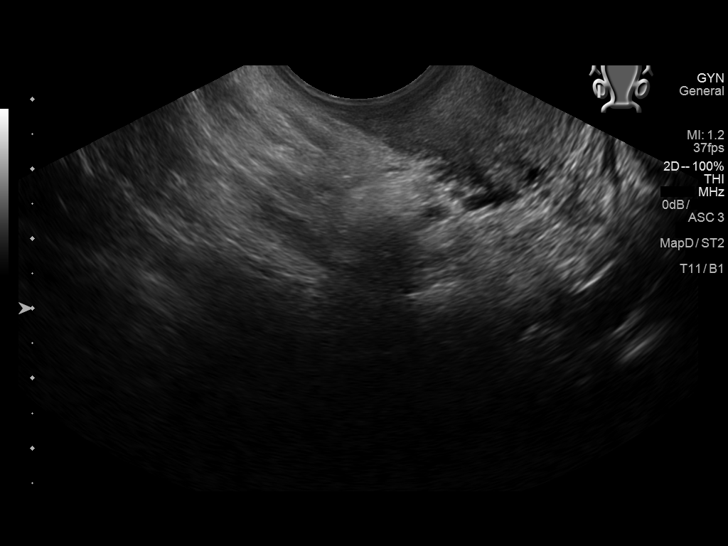
[im 56/61]
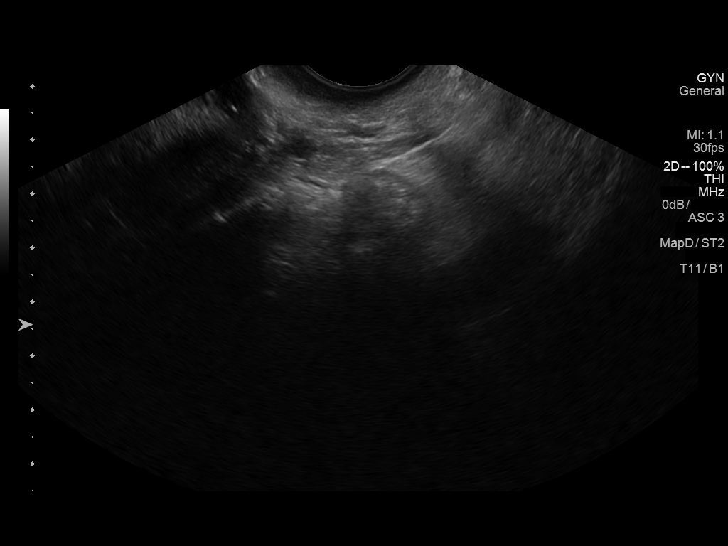
[im 61/61]
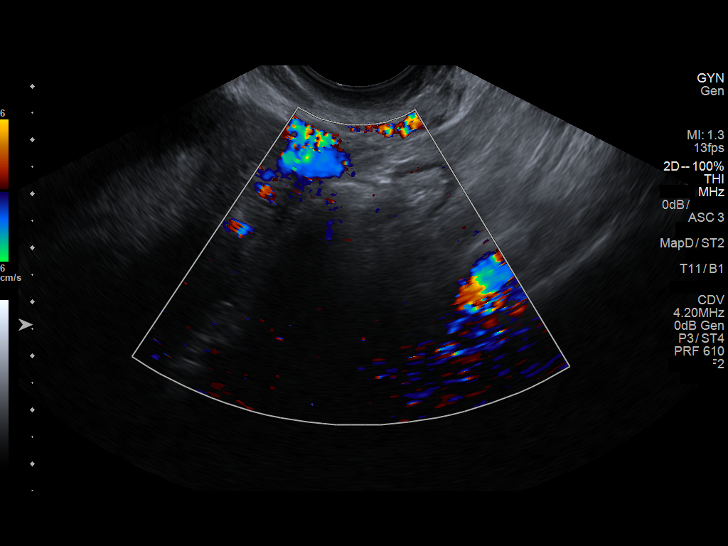

[14 of 25 positions shown; findings below may reference images not displayed]

FINDINGS: Uterus

Measurements: 9.8 x 4.6 x 4.9 cm. Uterus is anteverted. Nabothian
cysts in the cervix. No myometrial masses.

Endometrium

Thickness: 8.8 mm.  No focal abnormality visualized.

Right ovary

Right ovary is not visualized.

Left ovary

Left ovary is not visualized.

Other findings

No abnormal free fluid. Patient was unable to tolerate extending
endovaginal examination due to pain.
IMPRESSION: Normal ultrasound appearance of the uterus. Ovaries are not
visualized.

## 2017-12-13 ENCOUNTER — Ambulatory Visit
Admission: EM | Admit: 2017-12-13 | Discharge: 2017-12-13 | Disposition: A | Discharge status: Home / Self Care | Payer: BLUE CROSS/BLUE SHIELD | Attending: Family Medicine | Admitting: Family Medicine

## 2017-12-13 ENCOUNTER — Encounter: Payer: Self-pay | Admitting: Emergency Medicine

## 2017-12-13 ENCOUNTER — Other Ambulatory Visit: Payer: Self-pay

## 2017-12-13 DIAGNOSIS — J111 Influenza due to unidentified influenza virus with other respiratory manifestations: Secondary | ICD-10-CM | POA: Diagnosis not present

## 2017-12-13 DIAGNOSIS — R69 Illness, unspecified: Principal | ICD-10-CM

## 2017-12-13 MED ORDER — IBUPROFEN 600 MG PO TABS
600.0000 mg | ORAL_TABLET | Freq: Once | ORAL | Status: AC
  Administered 2017-12-13: 600 mg via ORAL

## 2017-12-13 MED ORDER — OSELTAMIVIR PHOSPHATE 75 MG PO CAPS: 75.0000 mg | ORAL_CAPSULE | Freq: Two times a day (BID) | ORAL | 0 refills | Status: DC

## 2017-12-13 NOTE — ED Provider Notes (Signed)
[facility]    CSN: [id number] Arrival date & time: 12/13/17  1931     History   Chief Complaint Chief Complaint  Patient presents with  . Cough  . Fever    HPI Brittany Pratt is a 42 y.o. female.   Feels like when had the flu in February.   The history is provided by the patient and a significant other. No language interpreter was used.  URI  Presenting symptoms: congestion, cough, fatigue, fever and sore throat   Presenting symptoms: no ear pain and no rhinorrhea   Severity:  Moderate Onset quality:  Sudden Duration:  2 days Timing:  Constant Progression:  Unchanged Chronicity:  New Relieved by:  Nothing Worsened by:  Nothing Ineffective treatments:  OTC medications and rest Associated symptoms: myalgias   Associated symptoms: no headaches   Risk factors: sick contacts   Risk factors comment:  Kids w similar s/s   Past Medical History:  Diagnosis Date  . Healthy adult on routine physical examination     Patient Active Problem List   Diagnosis Date Noted  . Influenza-like illness 12/13/2017  . BMI 40.0-44.9, adult (HCC) 07/23/2016    Past Surgical History:  Procedure Laterality Date  . APPENDECTOMY    . CESAREAN SECTION     x 3  . CHOLECYSTECTOMY    . KNEE ARTHROSCOPY      OB History   None      Home Medications    Prior to Admission medications   Medication Sig Start Date End Date Taking? Authorizing Provider  Acetaminophen (TYLENOL PO) Take 1,000 mg by mouth as needed.   Yes [provider]  ondansetron (ZOFRAN ODT) 4 MG disintegrating tablet Take 1 tablet (4 mg total) by mouth every 8 (eight) hours as needed. 04/18/17   Renford Dills, NP  oseltamivir (TAMIFLU) 75 MG capsule Take 1 capsule (75 mg total) by mouth every 12 (twelve) hours. 12/13/17   Jazziel Fitzsimmons, Para March, NP    Family History Family History  Problem Relation Age of Onset  . Cancer Mother   . Diabetes Mother   . Heart disease Son    aortic valve replacement at 61 yo  . Hyperlipidemia Maternal Aunt   . Hypertension Maternal Aunt   . Diabetes Maternal Aunt   . Hypertension Maternal Uncle   . Hyperlipidemia Maternal Uncle   . Diabetes Maternal Uncle     Social History Social History   Tobacco Use  . Smoking status: Never Smoker  . Smokeless tobacco: Never Used  Substance Use Topics  . Alcohol use: No  . Drug use: No     Allergies   Patient has no known allergies.   Review of Systems Review of Systems  Constitutional: Positive for fatigue and fever. Negative for chills.  HENT: Positive for congestion and sore throat. Negative for ear pain and rhinorrhea.   Eyes: Negative.   Respiratory: Positive for cough.   Gastrointestinal: Negative for nausea and vomiting.  Endocrine: Negative.   Genitourinary: Negative for dysuria.  Musculoskeletal: Positive for myalgias.  Skin: Negative for rash.  Allergic/Immunologic: Negative.   Neurological: Negative for headaches.  Hematological: Negative.   Psychiatric/Behavioral: Negative.   All other systems reviewed and are negative.    Physical Exam Triage Vital Signs ED Triage Vitals  Enc Vitals Group     BP      Pulse      Resp      Temp      Temp  src      SpO2      Weight      Height      Head Circumference      Peak Flow      Pain Score      Pain Loc      Pain Edu?      Excl. in GC?    No data found.  Updated Vital Signs BP 135/89 (BP Location: Left Arm)   Pulse 87   Temp 98.9 F (37.2 C) (Oral)   Ht 5' (1.524 m)   Wt 190 lb (86.2 kg)   LMP 12/09/2017   SpO2 99%   BMI 37.11 kg/m   Visual Acuity Right Eye Distance:   Left Eye Distance:   Bilateral Distance:    Right Eye Near:   Left Eye Near:    Bilateral Near:     Physical Exam  Constitutional: She is oriented to person, place, and time. She appears well-developed and well-nourished. She is active and cooperative. No distress.  HENT:  Head: Normocephalic.  Mouth/Throat:  Oropharynx is clear and moist.  Eyes: Pupils are equal, round, and reactive to light. Conjunctivae, EOM and lids are normal.  Neck: Normal range of motion. No tracheal deviation present.  Cardiovascular: Regular rhythm, normal heart sounds and normal pulses.  No murmur heard. Pulmonary/Chest: Effort normal and breath sounds normal.  Abdominal: Soft. Bowel sounds are normal. There is no tenderness.  Musculoskeletal: Normal range of motion.  Lymphadenopathy:    She has no cervical adenopathy.  Neurological: She is alert and oriented to person, place, and time. GCS eye subscore is 4. GCS verbal subscore is 5. GCS motor subscore is 6.  Skin: Skin is warm and dry. No rash noted.  Psychiatric: She has a normal mood and affect. Her speech is normal and behavior is normal.  Nursing note and vitals reviewed.    UC Treatments / Results  Labs (all labs ordered are listed, but only abnormal results are displayed) Labs Reviewed - No data to display  EKG None  Radiology No results found.  Procedures Procedures (including critical care time)  Medications Ordered in UC Medications  ibuprofen (ADVIL,MOTRIN) tablet 600 mg (600 mg Oral Given 12/13/17 1950)    Initial Impression / Assessment and Plan / UC Course  I have reviewed the triage vital signs and the nursing notes.  Pertinent labs & imaging results that were available during my care of the patient were reviewed by me and considered in my medical decision making (see chart for details).      Final Clinical Impressions(s) / UC Diagnoses   Final diagnoses:  Influenza-like illness     Discharge Instructions     Rest,push fluids, take tylenol/ibuprofen as label directed for discomfort. Take tamiflu as directed. Follow up with your PCP. Go tot Er for new or worsening symptoms.     ED Prescriptions    Medication Sig Dispense Auth. Provider   oseltamivir (TAMIFLU) 75 MG capsule Take 1 capsule (75 mg total) by mouth every 12  (twelve) hours. 10 capsule Nabor Thomann, Para March, NP     Controlled Substance Prescriptions    Clancy Gourd, NP 12/13/17 2022

## 2017-12-13 NOTE — Discharge Instructions (Signed)
Rest,push fluids, take tylenol/ibuprofen as label directed for discomfort. Take tamiflu as directed. Follow up with your PCP. Go tot Er for new or worsening symptoms.

## 2017-12-13 NOTE — ED Triage Notes (Signed)
Pt c/o fever, cough, runny nose, headache, chills, and body aches. Started 3 days ago.

## 2017-12-14 ENCOUNTER — Ambulatory Visit: Payer: BLUE CROSS/BLUE SHIELD | Admitting: Nurse Practitioner

## 2018-01-20 DIAGNOSIS — Z139 Encounter for screening, unspecified: Secondary | ICD-10-CM | POA: Diagnosis not present

## 2018-01-20 DIAGNOSIS — Z1231 Encounter for screening mammogram for malignant neoplasm of breast: Secondary | ICD-10-CM | POA: Diagnosis not present

## 2018-01-23 DIAGNOSIS — Z23 Encounter for immunization: Secondary | ICD-10-CM | POA: Diagnosis not present

## 2018-02-07 ENCOUNTER — Telehealth: Payer: Self-pay | Admitting: Nurse Practitioner

## 2018-02-07 NOTE — Telephone Encounter (Signed)
Patient will need to be seen for her physical or breast complaint before this can be ordered.

## 2018-02-07 NOTE — Telephone Encounter (Signed)
[name] [facility] order for 3D right breast addition view with US if needed faxed to [phone].  Her call back [phone]

## 2018-02-08 NOTE — Telephone Encounter (Signed)
[facility] called back and explained that patient had a mammogram on their mobile unit and she needs additional screening.  I asked them to send the mammogram report because we do not have it and we then can order additional views. The mammogram report should be in your box when we receive it.

## 2018-02-08 NOTE — Telephone Encounter (Signed)
Receipt of screening with incomplete result and breast asymmetry R breast. Paper order written to be faxed for additional views mammogram with possible R breast [place]

## 2018-02-08 NOTE — Telephone Encounter (Signed)
Attempted to contact the pt, no answer. LMOM to return my call.  

## 2018-02-14 NOTE — Telephone Encounter (Signed)
[facility]

## 2018-02-23 ENCOUNTER — Encounter: Payer: Self-pay | Admitting: Nurse Practitioner

## 2018-02-23 DIAGNOSIS — R922 Inconclusive mammogram: Secondary | ICD-10-CM | POA: Diagnosis not present

## 2018-02-23 DIAGNOSIS — N6311 Unspecified lump in the right breast, upper outer quadrant: Secondary | ICD-10-CM | POA: Diagnosis not present

## 2018-02-23 DIAGNOSIS — R928 Other abnormal and inconclusive findings on diagnostic imaging of breast: Secondary | ICD-10-CM | POA: Diagnosis not present

## 2018-04-11 ENCOUNTER — Other Ambulatory Visit: Payer: Self-pay

## 2018-04-11 ENCOUNTER — Ambulatory Visit (INDEPENDENT_AMBULATORY_CARE_PROVIDER_SITE_OTHER): Payer: BLUE CROSS/BLUE SHIELD | Admitting: Nurse Practitioner

## 2018-04-11 ENCOUNTER — Encounter: Payer: Self-pay | Admitting: Nurse Practitioner

## 2018-04-11 VITALS — BP 150/89 | HR 76 | Resp 16 | Ht 60.0 in | Wt 199.0 lb

## 2018-04-11 DIAGNOSIS — I10 Essential (primary) hypertension: Secondary | ICD-10-CM

## 2018-04-11 DIAGNOSIS — M778 Other enthesopathies, not elsewhere classified: Secondary | ICD-10-CM

## 2018-04-11 DIAGNOSIS — R928 Other abnormal and inconclusive findings on diagnostic imaging of breast: Secondary | ICD-10-CM | POA: Diagnosis not present

## 2018-04-11 LAB — COMPLETE METABOLIC PANEL WITH GFR
AG Ratio: 1.6 (calc) (ref 1.0–2.5)
ALT: 23 U/L (ref 6–29)
AST: 17 U/L (ref 10–30)
Albumin: 4.4 g/dL (ref 3.6–5.1)
Alkaline phosphatase (APISO): 66 U/L (ref 33–115)
BUN: 14 mg/dL (ref 7–25)
CO2: 31 mmol/L (ref 20–32)
Calcium: 9.5 mg/dL (ref 8.6–10.2)
Chloride: 101 mmol/L (ref 98–110)
Creat: 0.54 mg/dL (ref 0.50–1.10)
GFR, Est African American: 137 mL/min/{1.73_m2} (ref >=60)
GFR, Est Non African American: 118 mL/min/{1.73_m2} (ref >=60)
Globulin: 2.7 g/dL (calc) (ref 1.9–3.7)
Glucose, Bld: 108 mg/dL — ABNORMAL HIGH (ref 65–99)
Potassium: 4.7 mmol/L (ref 3.5–5.3)
Sodium: 139 mmol/L (ref 135–146)
Total Bilirubin: 0.7 mg/dL (ref 0.2–1.2)
Total Protein: 7.1 g/dL (ref 6.1–8.1)

## 2018-04-11 MED ORDER — PREDNISONE 20 MG PO TABS: ORAL_TABLET | ORAL | 0 refills | Status: DC

## 2018-04-11 MED ORDER — LISINOPRIL 10 MG PO TABS: 10.0000 mg | ORAL_TABLET | Freq: Every day | ORAL | 1 refills | Status: DC

## 2018-04-11 NOTE — Patient Instructions (Addendum)
Brittany BarreGloria Sandra Fayrene FearingAlfaro Pratt,   Thank you for coming in to clinic today.  1. lisinopril 10 mg one tablet daily (tomar uno pastillo del dia)  Goal: < 130/80    2. For your arm: Pain likely to improve with rest, reducing swelling/edema. - Continue Tylenol only with prednisone.  - after prednisone, may take one - ibuprofen OR aleve  Start prednisone taper over 10 days. Take prednisone taper 20 mg tablets Day 1-4 take 3 pills at one time;  Day 5-6: Take 2 pills;  Day 7-8: Take 1 pills;  Day 9-10: Take 1/2 pill; then stop.  Please schedule a follow-up appointment with Wilhelmina McardleLauren Jayel Inks, AGNP. Return in about 6 weeks (around 05/23/2018) for hypertension.  If you have any other questions or concerns, please feel free to call the clinic or send a message through MyChart. You may also schedule an earlier appointment if necessary.  You will receive a survey after today's visit either digitally by e-mail or paper by [employer] Southern[employer] mail. Your experiences and feedback matter to us.  Please respond so we know how we are doing as we provide care for you.   Wilhelmina McardleLauren Lailoni Baquera, DNP, AGNP-BC Adult Gerontology Nurse Practitioner [facility]   Cmo controlar su hipertensin Managing Your Hypertension La hipertensin se denomina usualmente presin arterial alta. Ocurre cuando la sangre presiona contra las paredes de las arterias con demasiada fuerza. Las arterias son los vasos sanguneos que transportan la sangre desde el corazn hacia todas las partes del cuerpo. La hipertensin hace que el corazn haga ms esfuerzo para Insurance account managerbombear sangre y Sears Holdings Corporationpuede provocar que las arterias se Armed forces training and education officerestrechen o Multimedia programmerendurezcan. La hipertensin no tratada o no controlada puede causar infarto de miocardio, accidente cerebrovascular, enfermedad renal y otros problemas. Qu son las Merchandiser, retaillecturas de presin arterial? Una lectura de la presin arterial consiste de un nmero ms alto sobre un nmero ms bajo. En condiciones ideales, la  presin arterial debe estar por debajo de 120/80. El primer nmero ("superior") es la presin sistlica. Es la medida de la presin de las arterias cuando el corazn late. El segundo nmero ("inferior") es la presin diastlica. Es la medida de la presin en las arterias cuando el corazn se relaja. Qu significa mi lectura de presin arterial? La presin arterial se clasifica en cuatro etapas. Sobre la base de la lectura de su presin arterial, el mdico puede usar las siguientes etapas para determinar si necesita tratamiento y de qu tipo. La presin sistlica y la presin diastlica se miden en una unidad llamada mm Hg. Normal  Presin sistlica: por debajo de 120.  Presin diastlica: por debajo de 80. Elevada  Presin sistlica: 120-129.  Presin diastlica: por debajo de 80. Etapa 1 de hipertensin  Presin sistlica: 130-139.  Presin diastlica: 80-89. Etapa 2 de hipertensin  Presin sistlica: 140 o ms.  Presin diastlica: 90 o ms. Cules son los riesgos para la salud asociados con la hipertensin? Controlar la hipertensin es una responsabilidad importante. La hipertensin no controlada puede causar:  Infarto de miocardio.  Accidente cerebrovascular.  Debilitamiento de los vasos sanguneos (aneurisma).  Insuficiencia cardaca.  Dao renal.  Dao ocular.  Sndrome metablico.  Problemas de memoria y concentracin. Qu cambios puedo hacer para controlar mi hipertensin? La hipertensin se puede controlar haciendo Danaher Corporationcambios en el estilo de vida y, posiblemente, tomando medicamentos. Su mdico le ayudar a crear un plan para bajar la presin arterial al rango normal. Comida y bebida   Siga una dieta con alto contenido de  fibras y [place], y con bajo contenido de sal (sodio), azcar agregada y Rosalin Hawking. Un ejemplo de plan alimenticio es la dieta DASH (Dietary Approaches to Stop Hypertension, Mtodos alimenticios para detener la hipertensin). Para alimentarse de  esta manera: ? Coma mucha fruta y verdura fresca. Trate de que la mitad del plato de cada comida sea de frutas y verduras. ? Coma cereales integrales, como pasta integral, arroz integral y pan integral. Llene aproximadamente un cuarto del plato con cereales integrales. ? Consuma productos lcteos con bajo contenido de grasa. ? Evite la ingesta de cortes de carne grasa, carne procesada o curada, y carne de ave con piel. Llene aproximadamente un cuarto del plato con protenas magras, como pescado, pollo sin piel, frijoles, huevos y tofu. ? Evite ingerir alimentos prehechos o procesados. En general, estos tienen mayor cantidad de sodio, azcar agregada y Steffanie Rain[place].  Reduzca su ingesta diaria de sodio. La mayora de las personas que tienen hipertensin deben comer menos de 1500 mg de sodio por C.[employer] Worldwide.  Limite el consumo de alcohol a no ms de 1 medida por da si es mujer y no est Orthoptist y a 2 medidas por da si es hombre. Una medida equivale a 12onzas de cerveza, 5onzas de vino o 1onzas de bebidas alcohlicas de alta graduacin. Estilo de vida  Trabaje con su mdico para mantener un peso saludable o Curator. Pregntele cual es su peso recomendado.  Realice al menos 30 minutos de ejercicio que haga que se acelere su corazn (ejercicio Magazine features editor) la DIRECTV de la [place]. Estas actividades pueden incluir caminar, nadar o andar en bicicleta.  Incluya ejercicios para fortalecer sus msculos (ejercicios de resistencia), como levantamiento de pesas, como parte de su rutina semanal de ejercicios. Intente realizar de este tipo de ejercicios al Kellogg a la [place].  No consuma ningn producto que contenga nicotina o tabaco, como cigarrillos y Administrator, Civil Service. Si necesita ayuda para dejar de fumar, consulte al American Express.  Controle las enfermedades a largo plazo (crnicas), como el colesterol alto o la diabetes. Control  Contrlese la presin arterial en su casa segn  las indicaciones del mdico. La presin arterial deseada puede variar en funcin de las enfermedades, la edad y otros factores personales.  Contrlese la presin arterial de manera regular, en la frecuencia indicada por su mdico. Trabaje con su mdico  Revise con su mdico todos los medicamentos que toma ya que puede haber efectos secundarios o interacciones.  Hable con su mdico acerca de la dieta, hbitos de ejercicio y otros factores del estilo de vida que pueden contribuir a la hipertensin.  Consulte a su mdico regularmente. Su mdico puede ayudarle a crear y [place] su plan para controlar la hipertensin. Debo tomar un medicamento para controlar mi presin arterial? El mdico puede recetarle medicamentos si los cambios en el estilo de vida no son suficientes para Museum/gallery curator la presin arterial y si:  Su presin arterial sistlica es de 130 o ms.  Su presin arterial diastlica es de 80 o ms. Tome los medicamentos solamente como se lo haya indicado el mdico. Siga cuidadosamente las indicaciones. Los medicamentos para la presin arterial deben tomarse segn las indicaciones. Los medicamentos pierden eficacia al omitir las dosis. El hecho de omitir las dosis tambin [place] el riesgo de otros problemas. Comunquese con un mdico si:  Piensa que tiene Runner, broadcasting/film/video a los medicamentos que ha tomado.  Tiene dolores de cabeza frecuentes (recurrentes).  Siente mareos.  Tiene hinchazn  en los tobillos.  Tiene problemas de visin. Solicite ayuda de inmediato si:  Siente un dolor de cabeza intenso o confusin.  Siente debilidad inusual, adormecimiento o que Hospital doctorse desmayar.  Siente un dolor intenso en el pecho o el abdomen.  Vomita repetidas veces.  Tiene dificultad para respirar. Resumen  La hipertensin se produce cuando la sangre bombea en las arterias con mucha fuerza. Si esta afeccin no se controla, podra correr riesgo de tener complicaciones graves.  La  presin arterial deseada puede variar en funcin de las enfermedades, la edad y otros factores personales. Para la Franklin Resourcesmayora de las personas, una presin arterial normal es menor que 120/80.  La hipertensin se puede controlar mediante cambios en el estilo de vida, tomando medicamentos, o ambas cosas. Los Danaher Corporationcambios en el estilo de vida incluyen prdida de peso, ingerir alimentos sanos, seguir una dieta baja en sodio, hacer ms ejercicio y Glass blower/designerlimitar el consumo de alcohol. Esta informacin no tiene Theme park managercomo fin reemplazar el consejo del mdico. Asegrese de hacerle al mdico cualquier pregunta que tenga. Document Released: 11/24/2011 Document Revised: 02/11/2016 Document Reviewed: 02/11/2016 Elsevier Interactive Patient Education  2019 ArvinMeritorElsevier Inc.   Lisinopril tablets Qu es Oneidaeste medicamento? El LISINOPRIL es un inhibidor de la ECA. Este medicamento se utiliza para el tratamiento de la alta presin sangunea o insuficiencia cardiaca. Tambin se [place] Islandsutiliza para proteger el corazn inmediatamente despus de un ataque cardaco. Este medicamento puede ser utilizado para otros usos; si tiene alguna pregunta consulte con su proveedor de atencin mdica o con su farmacutico. MARCAS COMUNES: Prinivil, Zestril Qu le debo informar a mi profesional de la salud antes de tomar este medicamento? Necesitan saber si usted presenta alguno de los Coventry Health Caresiguientes problemas o situaciones: diabetes enfermedad vascular o cardiaca enfermedad renal baja presin sangunea inflamacin previa de la lengua, el rostro o los labios con dificultad para Industrial/product designerrespirar, dificultad para tragar, ronquera o estrechamiento de la garganta una reaccin alrgica o inusual al lisinopril, a otros inhibidores de la ECA, al veneno de insectos, a alimentos, colorantes o conservantes si est embarazada o buscando quedar embarazada si est amamantando a un beb Cmo debo utilizar este medicamento? Tome este medicamento por va oral con un vaso de agua. Siga las  instrucciones de la etiqueta del [place]medicamento. Este medicamento se puede tomar con o sin alimentos. Si el Social workermedicamento le produce malestar estomacal, tmelo con alimentos. Tome su medicamento a intervalos regulares. No lo tome con una frecuencia mayor a la indicada. No deje de tomarlo, excepto si as lo indica su mdico. Hable con su pediatra para informarse acerca del uso de este medicamento en nios. Puede requerir atencin especial. Aunque este medicamento se puede recetar a nios tan pequeos como de 6 aos de edad en casos selectos, existen precauciones que deben tomarse. Sobredosis: Pngase en contacto inmediatamente con un centro toxicolgico o una sala de urgencia si usted cree que haya tomado demasiado medicamento. ATENCIN: Reynolds AmericanEste medicamento es solo para usted. No comparta este medicamento con nadie. Qu sucede si me olvido de una dosis? Si olvida una dosis, tmela lo antes posible. Si es casi la hora de la prxima dosis, tome slo esa dosis. No tome dosis adicionales o dobles. Qu puede interactuar con este medicamento? No tome este medicamento con ninguno de los siguientes frmacos: veneno de himenpteros sacubitril; valsartn Este medicamento tambin podra interactuar con las siguientes medicinas: aliskirn bloqueadores del receptor de angiotensina, como losartn o valsartn ciertos medicamentos para la diabetes diurticos everolimus compuestos de oro litio GulkanaAINE, medicamentos para  el dolor y la inflamacin, como ibuprofeno o naproxeno suplementos o sales de potasio sustitutos de la sal sirolimus temsirolimus Puede ser que esta lista no menciona todas las posibles interacciones. Informe a su profesional de Beazer Homes de Ingram Micro Inc productos a base de hierbas, medicamentos de [place] o suplementos nutritivos que est tomando. Si usted fuma, consume bebidas alcohlicas o si utiliza drogas ilegales, indqueselo tambin a su profesional de [employer]azer Homes. Algunas sustancias pueden interactuar con su  medicamento. A qu debo estar atento al usar PPL Corporation? Visite a su mdico o a su profesional de la salud para que revise su evolucin peridicamente. Revise su presin sangunea como se le indique. Pregunte a su mdico cul debe ser su presin sangunea y cundo Sports coach. No se trate usted mismo si tiene tos, resfro o Scientist, research (physical sciences) est usando este medicamento sin consultar con su mdico o con su profesional de Be[employer] Homes. Algunos ingredientes pueden aumentar su presin sangunea. Las mujeres deben informar a su mdico si estn buscando quedar embarazadas o si creen que podran estar embarazadas. Existe la posibilidad de efectos secundarios graves en un beb sin nacer. Para obtener ms informacin, hable con su profesional de la salud o su farmacutico. Consulte con su mdico o su profesional de la salud si tiene un ataque de diarrea grave, nuseas y vmitos, o sudoracin intensa. La prdida de demasiado lquido corporal puede hacer que sea peligroso tomar PPL Corporation. Puede experimentar somnolencia o mareos. No conduzca, no utilice maquinaria ni haga nada que Scientist, research (life sciences) en estado de alerta hasta que sepa cmo le afecta este frmaco. No se siente ni se ponga de pie con rapidez, especialmente si es un paciente de edad avanzada. Esto reduce el riesgo de mareos o Newell Rubbermaid. El alcohol puede aumentar los mareos y la somnolencia. Evite consumir bebidas alcohlicas. Evite consumir sustitutos de la sal, a menos que su mdico o profesional de la salud le indiquen lo contrario. Qu efectos secundarios puedo tener al Boston Scientific este medicamento? Efectos secundarios que debe informar a su mdico o a Producer, television/film/video de la salud tan pronto como sea posible: Therapist, art, como erupcin cutnea, comezn/picazn o urticarias, hinchazn de las manos, los pies, la cara, los labios, la garganta o la lengua problemas respiratorios signos y sntomas de lesin al rin, tales como  dificultad para orinar o cambios en la cantidad de orina signos y sntomas de aumento del nivel de Government social research officer, tales como debilidad muscular; dolor en el pecho; o ritmo cardiaco rpido o irregular signos y sntomas de lesin al hgado, como orina amarilla oscura o [place]; sensacin general de estar enfermo o sntomas gripales; heces claras; prdida de apetito; nuseas; dolor en la regin abdominal superior derecha; cansancio o debilidad inusual; color amarillento de los ojos o la piel signos y sntomas de presin sangunea baja, tales como [place], sensacin de Corporate investment banker o aturdimiento, cadas, cansancio o debilidad inusual dolor de Teaching laboratory technician con o sin nuseas y vmitos Efectos secundarios que generalmente no requieren atencin mdica (infrmelos a su mdico o a Producer, television/film/video de la salud si persisten o si son molestos): cambios en el sentido del gusto tos mareos fiebre Engineer, mining de cabeza sensibilidad a la luz Puede ser que esta lista no menciona todos los posibles efectos secundarios. Comunquese a su mdico por asesoramiento mdico Hewlett-Packard. Usted puede informar los efectos secundarios a la FDA por telfono al 1-800-FDA-1088. Dnde debo guardar mi medicina? Mantngala fuera del alcance de los nios. Gurdela a temperatura  Herb Grays, entre 15 y 30 grados C (60 y 68 grados F). Protjala de la humedad. Mantenga el envase bien cerrado. Deseche todo el medicamento que no haya utilizado, despus de la fecha de vencimiento. ATENCIN: Este folleto es un resumen. Puede ser que no cubra toda la posible informacin. Si usted tiene preguntas acerca de esta medicina, consulte con su mdico, su farmacutico o su profesional de Radiographer, therapeutic.  2019 Elsevier/Gold Standard (2016-04-01 00:00:00)

## 2018-04-11 NOTE — Progress Notes (Signed)
Subjective:    Patient ID: Brittany Pratt, female    DOB: Mar 16, 1975, 43 y.o.   MRN: [id number]  Brittany Pratt is a 43 y.o. female presenting on 04/11/2018 for Annual Exam; Arm Pain (1 month arm pain); and Headache (dizzy and headache 2-3 times a week usually when walking )  Pacific Interpreter: Spanish Brittany Pratt ID [id number]  HPI Arm Pain Pain is very severe in elbow.  Patient presents with arm pain x 1 month without injury. - Tylenol, ibuprofen, Aleve are not improving her pain - Pain is described as strong/severe pain  - Better is to put ice on it and rest her arm.  Extension has more pain with feeling like a tending is "getting wrapped up or stuck."  Bending relieves her pain. - Uses a "giant glove" for compression on her her arm during her work - Patient asked for this due to the pain to see if it makes it a little better, but is with - Packing socks/orthopedic items for work.  She does the same movements repetitively during day.  Headache/Dizziness Dizziness and headache occurs 2-3 times weekly when walking.  Has to hold onto something until the dizziness goes away.  Headache occurs throughout the day.  Dizziness lasts for about 5 minutes.  Dizziness occurs with walking, bending over. 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  - Headache occurs many times.   Is very bad with "giant head" sensation 2 times per week for about 4 months.   - Denies nausea, vomiting, light sensitivity. - Also endorses blurry vision at times with headache and dizziness.  Social History   Tobacco Use  . Smoking status: Never Smoker  . Smokeless tobacco: Never Used  Substance Use Topics  . Alcohol use: No  . Drug use: No    Review of Systems Per HPI unless specifically indicated above     Objective:    BP (!) 150/89   Pulse 76   Resp 16   Ht 5' (1.524 m)   Wt 199 lb (90.3 kg)   LMP 04/08/2018   SpO2 100%   BMI  38.86 kg/m   Wt Readings from Last 3 Encounters:  04/11/18 199 lb (90.3 kg)  12/13/17 190 lb (86.2 kg)  04/18/17 185 lb (83.9 kg)    Physical Exam Vitals signs reviewed.  Constitutional:      General: She is not in acute distress.    Appearance: She is well-developed.  HENT:     Head: Normocephalic and atraumatic.     Mouth/Throat:     Mouth: Mucous membranes are moist.  Cardiovascular:     Rate and Rhythm: Normal rate and regular rhythm.     Pulses:          Radial pulses are 2+ on the right side and 2+ on the left side.       Posterior tibial pulses are 1+ on the right side and 1+ on the left side.     Heart sounds: Normal heart sounds, S1 normal and S2 normal.  Pulmonary:     Effort: Pulmonary effort is normal. No respiratory distress.     Breath sounds: Normal breath sounds and air entry.  Abdominal:     Palpations: Abdomen is soft.  Musculoskeletal:     Right lower leg: No edema.     Left lower leg: No edema.  Skin:    General:  Skin is warm and dry.     Capillary Refill: Capillary refill takes less than 2 seconds.  Neurological:     Mental Status: She is alert and oriented to person, place, and time. Mental status is at baseline.     GCS: GCS eye subscore is 4. GCS verbal subscore is 5. GCS motor subscore is 6.     Cranial Nerves: No cranial nerve deficit.     Sensory: No sensory deficit.     Gait: Gait normal.     Deep Tendon Reflexes: Reflexes are normal and symmetric. Reflexes normal.  Psychiatric:        Attention and Perception: Attention normal.        Mood and Affect: Mood and affect normal.        Behavior: Behavior normal. Behavior is cooperative.    Results for orders placed or performed during the hospital encounter of 04/18/17  Rapid strep screen  Result Value Ref Range   Streptococcus, Group A Screen (Direct) NEGATIVE NEGATIVE  Culture, group A strep  Result Value Ref Range   Specimen Description      THROAT Performed at [facility], [address] [place], [place] [address]    Special Requests      NONE Reflexed from [id number] Performed at [facility], [address] [place], [place] [address]    Culture      NO GROUP A STREP (S.PYOGENES) ISOLATED Performed at [facility], [address] [place], [place] [address]    Report Status 04/21/2017 FINAL       Assessment & Plan:   Problem List Items Addressed This Visit    None    Visit Diagnoses    Essential hypertension    -  Primary New diagnosis is likely cause of headaches and dizziness.  Patient currently uncontrolled with BP > goal 130/80.  Plan: 1. Encouraged heart healthy diet, increasing exercise.  Provided written information in spanish in AVS about hypertension. 2. START lisinopril 10 mg once daily.  Discussed possible side effects of angioedema (rare), cough (common and reversible), kidney damage (rare, increase monitoring with start). 3. CMP today for renal function,  Repeat in 2 weeks after starting med. 4. Follow-up 6 weeks for med management.   Relevant Medications   lisinopril (PRINIVIL,ZESTRIL) 10 MG tablet   Other Relevant Orders   COMPLETE METABOLIC PANEL WITH GFR   Left elbow tendinitis     Pain likely self-limited.  Tendon irritation/swelling possible complicated by repetitive work movements.  Not currently responding to conservative management as outpatient.  Plan:  1. Treat with OTC pain meds (acetaminophen and ibuprofen).  Discussed alternate dosing and max dosing only take NSAID after prednisone course completed. Patient verbalizes understanding. - Start prednisone taper over 10 days. Take prednisone taper 20 mg tablets Day 1-4 take 3 pills at one time; Day 5-6: Take 2 pills; Day 7-8: Take 1 pills; Day 9-10: Take 1/2 pill; then stop. 2. Apply heat and/or ice to affected area. 3. May also apply a muscle rub with lidocaine or lidocaine patch after heat or ice. 4. May consider future OT or PT.  If no  improvement with prednisone, may also need orthopedic referral. 5. Follow up prn 2-6 weeks.   Relevant Medications   predniSONE (DELTASONE) 20 MG tablet   Abnormality of right breast on screening mammogram     Patient reports needs follow-up on mammogram.  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  Patient requests to have her recommended 6 month follow[place] in [place].  6 month follow-up is due June 2020.  Orders placed.   Relevant Orders   MM Digital Diagnostic Bilat   US BREAST LTD UNI RIGHT INC AXILLA      Meds ordered this encounter  Medications  . lisinopril (PRINIVIL,ZESTRIL) 10 MG tablet    Sig: Take 1 tablet (10 mg total) by mouth daily.    Dispense:  90 tablet    Refill:  1    Order Specific Question:   Supervising Provider    Answer:   Smitty CordsKARAMALEGOS, ALEXANDER J [[id number]]  . predniSONE (DELTASONE) 20 MG tablet    Sig: Day 1-4 take 3 pills once daily.  Day 5-6 take 2 pills.  Day 7-8 take 1 pill.  Day 9-10 take 1/2 pill.    Dispense:  19 tablet    Refill:  0    Order Specific Question:   Supervising Provider    Answer:   Smitty CordsKARAMALEGOS, ALEXANDER J [[id number]]    Follow up plan: Return in about 6 weeks (around 05/23/2018) for hypertension AND 2 weeks for physical.  Wilhelmina McardleLauren Deshay Kirstein, DNP, AGPCNP-BC Adult Gerontology Primary Care Nurse Practitioner [facility] 04/11/2018, 9:24 AM

## 2018-04-25 ENCOUNTER — Encounter: Payer: Self-pay | Admitting: Nurse Practitioner

## 2018-04-25 ENCOUNTER — Ambulatory Visit (INDEPENDENT_AMBULATORY_CARE_PROVIDER_SITE_OTHER): Payer: BLUE CROSS/BLUE SHIELD | Admitting: Nurse Practitioner

## 2018-04-25 ENCOUNTER — Other Ambulatory Visit: Payer: Self-pay

## 2018-04-25 VITALS — BP 105/54 | HR 71 | Temp 98.1°F | Ht 60.0 in | Wt 195.8 lb

## 2018-04-25 DIAGNOSIS — Z0000 Encounter for general adult medical examination without abnormal findings: Secondary | ICD-10-CM

## 2018-04-25 DIAGNOSIS — R799 Abnormal finding of blood chemistry, unspecified: Secondary | ICD-10-CM | POA: Diagnosis not present

## 2018-04-25 NOTE — Patient Instructions (Addendum)
Keturah Barre Fayrene Fearing,   Thank you for coming in to clinic today.  1. Continuar los cambios de su dieta.  2. Continuar activitdades regulares.  Please schedule a follow-up appointment with Wilhelmina Mcardle, AGNP. Return in about 1 year (around 04/26/2019) for annual physical AND change hypertension to April.  If you have any other questions or concerns, please feel free to call the clinic or send a message through MyChart. You may also schedule an earlier appointment if necessary.  You will receive a survey after today's visit either digitally by e-mail or paper by [employer]. Your experiences and feedback matter to [place].  Please respond so we know how we are doing as we provide care for you.   Wilhelmina Mcardle, DNP, AGNP-BC Adult Gerontology Nurse Practitioner [facility], [facility]

## 2018-04-25 NOTE — Progress Notes (Signed)
Subjective:    Patient ID: Brittany Pratt, female    DOB: 09/19/1975, 43 y.o.   MRN: [id number]  Brittany Pratt is a 43 y.o. female presenting on 04/25/2018 for Annual Exam (blood pressure ranging 134-140/ 82/89)  [employer] Telephone Interpreter : Darl PikesSusan     ID: [id number]  HPI Annual Physical Exam Patient has been feeling well.  They have no acute concerns today.    HEALTH MAINTENANCE: Weight/BMI: generally stable, lost 4 lbs in last month Physical activity: generally active Diet: green shakes with fruit and vegetables in morning, eating more green vegetables, less salt Seatbelt: always Sunscreen: not regularly, but if prolonged exposure PAP: 09/2016 - normal (-/-) Mammogram: discussed - no family history of breast cancer; Past BiRADS 3 and needs Follow-up in June (ordered) HIV: due Optometry: regular Dentistry: regular cleanings  VACCINES: Tetanus: up to date Influenza: addressed  Social History   Tobacco Use  . Smoking status: Never Smoker  . Smokeless tobacco: Never Used  Substance Use Topics  . Alcohol use: No  . Drug use: No    Review of Systems Per HPI unless specifically indicated above     Objective:    BP (!) 105/54 (BP Location: Left Arm, Patient Position: Sitting, Cuff Size: Large)   Pulse 71   Temp 98.1 F (36.7 C) (Oral)   Ht 5' (1.524 m)   Wt 195 lb 12.8 oz (88.8 kg)   LMP 04/08/2018   BMI 38.24 kg/m   Wt Readings from Last 3 Encounters:  04/25/18 195 lb 12.8 oz (88.8 kg)  04/11/18 199 lb (90.3 kg)  12/13/17 190 lb (86.2 kg)    Physical Exam Vitals signs and nursing note reviewed.  Constitutional:      General: She is not in acute distress.    Appearance: She is well-developed.  HENT:     Head: Normocephalic and atraumatic.     Right Ear: External ear normal.     Left Ear: External ear normal.     Nose: Nose normal.  Eyes:     Conjunctiva/sclera: Conjunctivae normal.     Pupils: Pupils are equal, round, and  reactive to light.  Neck:     Musculoskeletal: Normal range of motion and neck supple.     Thyroid: No thyromegaly.     Vascular: No JVD.     Trachea: No tracheal deviation.  Cardiovascular:     Rate and Rhythm: Normal rate and regular rhythm.     Heart sounds: Normal heart sounds. No murmur. No friction rub. No gallop.   Pulmonary:     Effort: Pulmonary effort is normal. No respiratory distress.     Breath sounds: Normal breath sounds.  Chest:       Comments: Breast - Normal exam w/ symmetric breasts, small/tender lump noted at approx 1'oclock, no other masses, no nipple discharge, no skin changes or tenderness.   Abdominal:     General: Bowel sounds are normal. There is no distension.     Palpations: Abdomen is soft.     Tenderness: There is no abdominal tenderness.  Musculoskeletal: Normal range of motion.  Lymphadenopathy:     Cervical: No cervical adenopathy.  Skin:    General: Skin is warm and dry.  Neurological:     Mental Status: She is alert and oriented to person, place, and time.     Cranial Nerves: No cranial nerve deficit.  Psychiatric:        Behavior: Behavior normal.  Thought Content: Thought content normal.        Judgment: Judgment normal.    Results for orders placed or performed in visit on 04/25/18  Lipid panel  Result Value Ref Range   Cholesterol 198 <200 mg/dL   HDL 60 > OR = 50 mg/dL   Triglycerides 694 <854 mg/dL   LDL Cholesterol (Calc) 117 (H) mg/dL (calc)   Total CHOL/HDL Ratio 3.3 <5.0 (calc)   Non-HDL Cholesterol (Calc) 138 (H) <130 mg/dL (calc)  TSH  Result Value Ref Range   TSH 2.16 mIU/L  COMPLETE METABOLIC PANEL WITH GFR  Result Value Ref Range   Glucose, Bld 103 (H) 65 - 99 mg/dL   BUN 11 7 - 25 mg/dL   Creat 6.27 0.35 - 0.09 mg/dL   GFR, Est Non African American 110 > OR = 60 mL/min/1.54m2   GFR, Est African American 128 > OR = 60 mL/min/1.19m2   BUN/Creatinine Ratio NOT APPLICABLE 6 - 22 (calc)   Sodium 140 135 - 146  mmol/L   Potassium 4.1 3.5 - 5.3 mmol/L   Chloride 104 98 - 110 mmol/L   CO2 27 20 - 32 mmol/L   Calcium 9.0 8.6 - 10.2 mg/dL   Total Protein 6.7 6.1 - 8.1 g/dL   Albumin 4.4 3.6 - 5.1 g/dL   Globulin 2.3 1.9 - 3.7 g/dL (calc)   AG Ratio 1.9 1.0 - 2.5 (calc)   Total Bilirubin 0.6 0.2 - 1.2 mg/dL   Alkaline phosphatase (APISO) 61 31 - 125 U/L   AST 15 10 - 30 U/L   ALT 15 6 - 29 U/L  CBC with Differential/Platelet  Result Value Ref Range   WBC 9.4 3.8 - 10.8 Thousand/uL   RBC 4.79 3.80 - 5.10 Million/uL   Hemoglobin 13.3 11.7 - 15.5 g/dL   HCT 38.1 82.9 - 93.7 %   MCV 81.8 80.0 - 100.0 fL   MCH 27.8 27.0 - 33.0 pg   MCHC 33.9 32.0 - 36.0 g/dL   RDW 16.9 67.8 - 93.8 %   Platelets 256 140 - 400 Thousand/uL   MPV 10.6 7.5 - 12.5 fL   Neutro Abs 5,978 1,500 - 7,800 cells/uL   Lymphs Abs 2,735 850 - 3,900 cells/uL   Absolute Monocytes 451 200 - 950 cells/uL   Eosinophils Absolute 197 15 - 500 cells/uL   Basophils Absolute 38 0 - 200 cells/uL   Neutrophils Relative % 63.6 %   Total Lymphocyte 29.1 %   Monocytes Relative 4.8 %   Eosinophils Relative 2.1 %   Basophils Relative 0.4 %  Hemoglobin A1c  Result Value Ref Range   Hgb A1c MFr Bld 6.0 (H) <5.7 % of total Hgb   Mean Plasma Glucose 126 (calc)   eAG (mmol/L) 7.0 (calc)  HIV Antibody (routine testing w rflx)  Result Value Ref Range   HIV 1&2 Ab, 4th Generation NON-REACTIVE NON-REACTI      Assessment & Plan:   Problem List Items Addressed This Visit    None    Visit Diagnoses    Physical exam    -  Primary   Relevant Orders   Lipid panel (Completed)   TSH (Completed)   COMPLETE METABOLIC PANEL WITH GFR (Completed)   CBC with Differential/Platelet (Completed)   Hemoglobin A1c (Completed)   HIV Antibody (routine testing w rflx) (Completed)      Annual physical exam without new findings.  Well adult with no acute concerns.  Plan: 1. Obtain health maintenance  screenings as above according to age. - Increase  physical activity to 30 minutes most days of the week.  - Eat healthy diet high in vegetables and fruits; low in refined carbohydrates. - Screening labs and tests as ordered - Reviewed orders for follow-up mammogram are placed.  Confirmed with patient she will receive a phone call, likely in June. 2. Return 1 year for annual physical.   Follow up plan: Return in about 1 year (around 04/26/2019) for annual physical AND change hypertension to April (2 months).  Wilhelmina McardleLauren Nalany Steedley, DNP, AGPCNP-BC Adult Gerontology Primary Care Nurse Practitioner [facility] 04/25/2018, 9:16 AM

## 2018-04-26 LAB — CBC WITH DIFFERENTIAL/PLATELET
Absolute Monocytes: 451 cells/uL (ref 200–950)
Basophils Absolute: 38 cells/uL (ref 0–200)
Basophils Relative: 0.4 %
Eosinophils Absolute: 197 cells/uL (ref 15–500)
Eosinophils Relative: 2.1 %
HCT: 39.2 % (ref 35.0–45.0)
Hemoglobin: 13.3 g/dL (ref 11.7–15.5)
Lymphs Abs: 2735 cells/uL (ref 850–3900)
MCH: 27.8 pg (ref 27.0–33.0)
MCHC: 33.9 g/dL (ref 32.0–36.0)
MCV: 81.8 fL (ref 80.0–100.0)
MPV: 10.6 fL (ref 7.5–12.5)
Monocytes Relative: 4.8 %
Neutro Abs: 5978 cells/uL (ref 1500–7800)
Neutrophils Relative %: 63.6 %
Platelets: 256 10*3/uL (ref 140–400)
RBC: 4.79 10*6/uL (ref 3.80–5.10)
RDW: 13.2 % (ref 11.0–15.0)
Total Lymphocyte: 29.1 %
WBC: 9.4 10*3/uL (ref 3.8–10.8)

## 2018-04-26 LAB — COMPLETE METABOLIC PANEL WITH GFR
AG Ratio: 1.9 (calc) (ref 1.0–2.5)
ALT: 15 U/L (ref 6–29)
AST: 15 U/L (ref 10–30)
Albumin: 4.4 g/dL (ref 3.6–5.1)
Alkaline phosphatase (APISO): 61 U/L (ref 31–125)
BUN: 11 mg/dL (ref 7–25)
CO2: 27 mmol/L (ref 20–32)
Calcium: 9 mg/dL (ref 8.6–10.2)
Chloride: 104 mmol/L (ref 98–110)
Creat: 0.66 mg/dL (ref 0.50–1.10)
GFR, Est African American: 128 mL/min/{1.73_m2} (ref >=60)
GFR, Est Non African American: 110 mL/min/{1.73_m2} (ref >=60)
Globulin: 2.3 g/dL (calc) (ref 1.9–3.7)
Glucose, Bld: 103 mg/dL — ABNORMAL HIGH (ref 65–99)
Potassium: 4.1 mmol/L (ref 3.5–5.3)
Sodium: 140 mmol/L (ref 135–146)
Total Bilirubin: 0.6 mg/dL (ref 0.2–1.2)
Total Protein: 6.7 g/dL (ref 6.1–8.1)

## 2018-04-26 LAB — HEMOGLOBIN A1C
Hgb A1c MFr Bld: 6 % of total Hgb — ABNORMAL HIGH (ref <5.7)
Mean Plasma Glucose: 126 (calc)
eAG (mmol/L): 7 (calc)

## 2018-04-26 LAB — LIPID PANEL
Cholesterol: 198 mg/dL (ref <200)
HDL: 60 mg/dL (ref >=50)
LDL Cholesterol (Calc): 117 mg/dL (calc) — ABNORMAL HIGH
Non-HDL Cholesterol (Calc): 138 mg/dL (calc) — ABNORMAL HIGH (ref <130)
Total CHOL/HDL Ratio: 3.3 (calc) (ref <5.0)
Triglycerides: 104 mg/dL (ref <150)

## 2018-04-26 LAB — TSH: TSH: 2.16 mIU/L

## 2018-04-26 LAB — HIV ANTIBODY (ROUTINE TESTING W REFLEX): HIV 1&2 Ab, 4th Generation: NONREACTIVE

## 2018-04-27 ENCOUNTER — Encounter: Payer: Self-pay | Admitting: Nurse Practitioner

## 2018-05-22 ENCOUNTER — Ambulatory Visit: Payer: BLUE CROSS/BLUE SHIELD | Admitting: Nurse Practitioner

## 2018-05-27 ENCOUNTER — Ambulatory Visit
Admission: EM | Admit: 2018-05-27 | Discharge: 2018-05-27 | Disposition: A | Discharge status: Home / Self Care | Payer: BLUE CROSS/BLUE SHIELD | Attending: Family Medicine | Admitting: Family Medicine

## 2018-05-27 DIAGNOSIS — R05 Cough: Secondary | ICD-10-CM | POA: Diagnosis not present

## 2018-05-27 DIAGNOSIS — J988 Other specified respiratory disorders: Secondary | ICD-10-CM

## 2018-05-27 DIAGNOSIS — B9789 Other viral agents as the cause of diseases classified elsewhere: Secondary | ICD-10-CM

## 2018-05-27 DIAGNOSIS — J029 Acute pharyngitis, unspecified: Secondary | ICD-10-CM | POA: Diagnosis not present

## 2018-05-27 LAB — RAPID INFLUENZA A&B ANTIGENS
Influenza A (ARMC): NEGATIVE
Influenza B (ARMC): NEGATIVE

## 2018-05-27 LAB — RAPID STREP SCREEN (MED CTR MEBANE ONLY): Streptococcus, Group A Screen (Direct): NEGATIVE

## 2018-05-27 MED ORDER — HYDROCODONE-HOMATROPINE 5-1.5 MG/5ML PO SYRP: 5.0000 mL | ORAL_SOLUTION | Freq: Four times a day (QID) | ORAL | 0 refills | Status: DC | PRN

## 2018-05-27 NOTE — Discharge Instructions (Signed)
Flu negative.  Strep negative.  Cough medication every 6 hours as needed.  Take care  Dr. Adriana Simas

## 2018-05-27 NOTE — ED Provider Notes (Signed)
[facility]    CSN: [id number] Arrival date & time: 05/27/18  1554  History   Chief Complaint Chief Complaint  Patient presents with  . Cough   HPI  43 year old female presents with cough.  Patient reports that she has been sick since Monday.  She reports cough and associated chest discomfort.  She states that her chest discomfort seems to be from a cough.  Associated body aches, sore throat.  Also reports chills.  No fever.  No medications or interventions tried.  No known exacerbating or relieving factors.  No other complaints.  PMH, Surgical Hx, Family Hx, Social History reviewed and updated as below.  PMH: HTN, Morbid obesity  Past Surgical History:  Procedure Laterality Date  . APPENDECTOMY    . CESAREAN SECTION     x 3  . CHOLECYSTECTOMY    . KNEE ARTHROSCOPY      OB History   No obstetric history on file.    Home Medications    Prior to Admission medications   Medication Sig Start Date End Date Taking? Authorizing Provider  Acetaminophen (TYLENOL PO) Take 1,000 mg by mouth as needed.   Yes [provider]  ibuprofen (ADVIL,MOTRIN) 400 MG tablet Take 400 mg by mouth every 6 (six) hours as needed.   Yes [provider]  lisinopril (PRINIVIL,ZESTRIL) 10 MG tablet Take 1 tablet (10 mg total) by mouth daily. 04/11/18  Yes Galen Manila, NP  HYDROcodone-homatropine [facility]) 5-1.5 MG/5ML syrup Take 5 mLs by mouth every 6 (six) hours as needed. 05/27/18   Tommie Sams, DO    Family History Family History  Problem Relation Age of Onset  . Cancer Mother        stomach  . Diabetes Mother   . Heart disease Son        aortic valve replacement at 7 yo  . Hyperlipidemia Maternal Aunt   . Hypertension Maternal Aunt   . Diabetes Maternal Aunt   . Hypertension Maternal Uncle   . Hyperlipidemia Maternal Uncle   . Diabetes Maternal Uncle     Social History Social History   Tobacco Use  . Smoking status: Never Smoker  .  Smokeless tobacco: Never Used  Substance Use Topics  . Alcohol use: No  . Drug use: No     Allergies   Patient has no known allergies.   Review of Systems Review of Systems  Constitutional: Positive for chills. Negative for fever.  HENT: Positive for sore throat.   Respiratory: Positive for cough.   Musculoskeletal:       Body aches.   Physical Exam Triage Vital Signs ED Triage Vitals  Enc Vitals Group     BP 05/27/18 1608 136/79     Pulse Rate 05/27/18 1608 79     Resp 05/27/18 1608 16     Temp 05/27/18 1608 98.9 F (37.2 C)     Temp Source 05/27/18 1608 Oral     SpO2 05/27/18 1608 100 %     Weight 05/27/18 1603 194 lb (88 kg)     Height 05/27/18 1603 5' (1.524 m)     Head Circumference --      Peak Flow --      Pain Score 05/27/18 1602 8     Pain Loc --      Pain Edu? --      Excl. in GC? --    Updated Vital Signs BP 136/79 (BP Location: Left Arm)   Pulse 79  Temp 98.9 F (37.2 C) (Oral)   Resp 16   Ht 5' (1.524 m)   Wt 88 kg   SpO2 100%   BMI 37.89 kg/m   Visual Acuity Right Eye Distance:   Left Eye Distance:   Bilateral Distance:    Right Eye Near:   Left Eye Near:    Bilateral Near:     Physical Exam Vitals signs and nursing note reviewed.  Constitutional:      General: She is not in acute distress.    Appearance: Normal appearance. She is obese.  HENT:     Head: Normocephalic and atraumatic.     Ears:     Comments: TMs without evidence of infection bilaterally.    Mouth/Throat:     Pharynx: Oropharynx is clear. No oropharyngeal exudate.  Eyes:     General:        Right eye: No discharge.        Left eye: No discharge.     Conjunctiva/sclera: Conjunctivae normal.  Cardiovascular:     Rate and Rhythm: Normal rate and regular rhythm.  Pulmonary:     Effort: Pulmonary effort is normal.     Breath sounds: Normal breath sounds.  Neurological:     Mental Status: She is alert.  Psychiatric:        Mood and Affect: Mood normal.         Behavior: Behavior normal.    UC Treatments / Results  Labs (all labs ordered are listed, but only abnormal results are displayed) Labs Reviewed  RAPID INFLUENZA A&B ANTIGENS ([facility] ONLY)  RAPID STREP SCREEN ([facility] ONLY)  CULTURE, GROUP A STREP [facility])    EKG None  Radiology No results found.  Procedures Procedures (including critical care time)  Medications Ordered in UC Medications - No data to display  Initial Impression / Assessment and Plan / UC Course  I have reviewed the triage vital signs and the nursing notes.  Pertinent labs & imaging results that were available during my care of the patient were reviewed by me and considered in my medical decision making (see chart for details).     43 year old female presents with a viral respiratory infection.  Hycodan for cough.  Supportive care.  Final Clinical Impressions(s) / UC Diagnoses   Final diagnoses:  Viral respiratory infection     Discharge Instructions     Flu negative.  Strep negative.  Cough medication every 6 hours as needed.  Take care  Dr. Adriana Simas    ED Prescriptions    Medication Sig Dispense Auth. Provider   HYDROcodone-homatropine (HYCODAN) 5-1.5 MG/5ML syrup Take 5 mLs by mouth every 6 (six) hours as needed. 120 mL Tommie Sams, DO     Controlled Substance Prescriptions [place] Controlled Substance Registry consulted? Not Applicable   Tommie Sams, DO 05/27/18 1650

## 2018-05-27 NOTE — ED Triage Notes (Signed)
Chest pain due to cough, body ache, sore throat due to cough, chills, haven't checked for fever but felt warm, nausea onset 6 days.

## 2018-05-30 LAB — CULTURE, GROUP A STREP (THRC)

## 2018-06-27 ENCOUNTER — Ambulatory Visit (INDEPENDENT_AMBULATORY_CARE_PROVIDER_SITE_OTHER): Payer: BLUE CROSS/BLUE SHIELD | Admitting: Nurse Practitioner

## 2018-06-27 ENCOUNTER — Encounter: Payer: Self-pay | Admitting: Nurse Practitioner

## 2018-06-27 ENCOUNTER — Other Ambulatory Visit: Payer: Self-pay

## 2018-06-27 ENCOUNTER — Ambulatory Visit: Payer: BLUE CROSS/BLUE SHIELD | Admitting: Nurse Practitioner

## 2018-06-27 ENCOUNTER — Other Ambulatory Visit: Payer: Self-pay | Admitting: Nurse Practitioner

## 2018-06-27 DIAGNOSIS — I10 Essential (primary) hypertension: Secondary | ICD-10-CM | POA: Diagnosis not present

## 2018-06-27 DIAGNOSIS — J3089 Other allergic rhinitis: Secondary | ICD-10-CM | POA: Diagnosis not present

## 2018-06-27 MED ORDER — VALSARTAN 40 MG PO TABS: 40.0000 mg | ORAL_TABLET | Freq: Every day | ORAL | 1 refills | Status: DC

## 2018-06-27 MED ORDER — FLUTICASONE PROPIONATE 50 MCG/ACT NA SUSP: 2.0000 | Freq: Every day | NASAL | 5 refills | Status: DC

## 2018-06-27 NOTE — Progress Notes (Signed)
Telemedicine Encounter: Disclosed to patient at start of encounter that we will provide appropriate telemedicine services.  Patient consents to be treated via phone prior to discussion. - Patient is at her home and is accessed via telephone. - Services are provided by Brittany Pratt from [facility].  Patient was seen via phone with the assistance of Spanish interpreter Brittany Pratt  Subjective:    Patient ID: Brittany Pratt, female    DOB: [birth year], 43 y.o.   MRN: [id number]  Brittany Pratt is a 43 y.o. female presenting on 06/27/2018 for Hypertension (pt was recently treated for a cough x 4 weeks ago. Diagnose with allergy.)  HPI Seasonal allergies with Cough - Patient went to urgent care with dx of URI in mid march.  Patient complains that cough caused stress urinary incontinence.  Now that cough is improving with new treatments of prednisone taper, Mucinex, loratadine and Flonase, less of concern.  Nearly fully resolved at this time.  Reports that she only a small cough, usually only starts after she leaves work.  Has been out of work for the last week and is having no cough at home.  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    Hypertension - She is not checking BP at home or outside of clinic.  Readings 138/89 about 4 days ago (sick day). Normally does not check BP.  04/19/2018 - 130/82.  BP was in range, so patient stopped checking BP.  She also stopped taking her BP medication due to cough. - Current medications: lisinopril 10 mg once daily --> No medication since end of January (1/28).  - She is not currently symptomatic. - Pt denies headache, lightheadedness, dizziness, changes in vision, chest tightness/pressure, palpitations, leg swelling, sudden loss of speech or loss of consciousness. - She  reports no regular exercise routine. - Her diet is moderate in salt,  moderate in fat, and moderate in carbohydrates.   Social History   Tobacco Use  . Smoking status: Never Smoker  . Smokeless tobacco: Never Used  Substance Use Topics  . Alcohol use: No  . Drug use: No   Review of Systems Per HPI unless specifically indicated above    Objective:    There were no vitals taken for this visit.  Patient's home BP reading today: 158/90 - automatic cuff  Wt Readings from Last 3 Encounters:  05/27/18 194 lb (88 kg)  04/25/18 195 lb 12.8 oz (88.8 kg)  04/11/18 199 lb (90.3 kg)    Physical Exam Patient remotely monitored.  Verbal communication appropriate.  Cognition normal.      Assessment & Plan:   Problem List Items Addressed This Visit      Cardiovascular and Mediastinum   Essential hypertension - Primary Uncontrolled hypertension.  BP goal < 130/80.  Pt is continuing to work on lifestyle modifications.  Taking medications tolerating well without side effects. Complications: no medication due to cough - patient stopped  Plan: 1. STOP lisinopril - START valsartan 40 mg once daily 2. Obtain labs next visit  3. Encouraged heart healthy diet and increasing exercise to 30 minutes most days of the week. 4. Check BP 1-2 x per week at home, keep log, and bring to clinic at next appointment. 5. Follow up 3 months.     Relevant Medications   valsartan (DIOVAN) 40 MG tablet    Other Visit Diagnoses    Environmental  and seasonal allergies     Patient with continued cough due to environmental allergies.  Improved now that infection is resolved and she started allergy medications. - Continue Flonase - Continue loratadine - May need to consider future obstructive airway as possible cause for symptoms with possible workplace fiber exposures. - Follow-up prn if cough returns.   Relevant Medications   fluticasone (FLONASE) 50 MCG/ACT nasal spray      Patient again asks about her follow-up mammogram as she was unable to schedule it after her last  visit and does not know why. [facility] interpreter and information received that patient needs [place] breast images release of information for [facility] prior to scheduling to release last mammogram images and report.  Patient is scheduled to come to our clinic tomorrow to sign this release which [facility].  [facility] has made an FYI that patient needs an interpreter for scheduling, so once this is received they will call to schedule with an interpreter.  Meds ordered this encounter  Medications  . fluticasone (FLONASE) 50 MCG/ACT nasal spray    Sig: Place 2 sprays into both nostrils daily.    Dispense:  16 g    Refill:  5    Order Specific Question:   Supervising Provider    Answer:   Smitty CordsKARAMALEGOS, ALEXANDER J [[id number]]  . valsartan (DIOVAN) 40 MG tablet    Sig: Take 1 tablet (40 mg total) by mouth daily.    Dispense:  90 tablet    Refill:  1    Order Specific Question:   Supervising Provider    Answer:   Smitty CordsKARAMALEGOS, ALEXANDER J [[id number]]   - Time spent in direct consultation with patient via telemedicine about above concerns: 24 minutes  Follow up plan: Return in about 3 months (around 09/26/2018) for hypertension.  Brittany McardleLauren Kayron Hicklin, DNP, AGPCNP-BC Adult Gerontology Primary Care Nurse Practitioner [facility] 06/27/2018, 3:36 PM

## 2018-06-27 NOTE — Patient Instructions (Addendum)
Brittany Pratt,   Thank you for coming in to clinic today.  1. Continuar loratadine (Claritin) 10 mg una pastilla cada dia 2. Continuar fluticasone in 3. STOP lisinopril 4. START valsartan 40 mg one tablet daily.   Please schedule a follow-up appointment with Brittany Pratt, AGNP. Return in about 3 months (around 09/26/2018) for hypertension.  If you have any other questions or concerns, please feel free to call the clinic or send a message through MyChart. You may also schedule an earlier appointment if necessary.  You will receive a survey after today's visit either digitally by e-mail or paper by [employer]. Your experiences and feedback matter to [place].  Please respond so we know how we are doing as we provide care for you.   Brittany Mcardle, DNP, AGNP-BC Adult Gerontology Nurse Practitioner [facility], [facility]

## 2018-08-28 DIAGNOSIS — Z20828 Contact with and (suspected) exposure to other viral communicable diseases: Secondary | ICD-10-CM | POA: Diagnosis not present

## 2018-08-29 ENCOUNTER — Ambulatory Visit: Payer: BLUE CROSS/BLUE SHIELD | Admitting: Family Medicine

## 2018-10-09 ENCOUNTER — Other Ambulatory Visit: Payer: Self-pay

## 2018-10-09 ENCOUNTER — Ambulatory Visit (INDEPENDENT_AMBULATORY_CARE_PROVIDER_SITE_OTHER): Payer: BC Managed Care – PPO | Admitting: Nurse Practitioner

## 2018-10-09 ENCOUNTER — Encounter: Payer: Self-pay | Admitting: Nurse Practitioner

## 2018-10-09 VITALS — BP 136/74 | HR 79 | Ht 60.0 in | Wt 206.6 lb

## 2018-10-09 DIAGNOSIS — G8929 Other chronic pain: Secondary | ICD-10-CM

## 2018-10-09 DIAGNOSIS — M25522 Pain in left elbow: Secondary | ICD-10-CM

## 2018-10-09 DIAGNOSIS — I10 Essential (primary) hypertension: Secondary | ICD-10-CM | POA: Diagnosis not present

## 2018-10-09 DIAGNOSIS — M25562 Pain in left knee: Secondary | ICD-10-CM | POA: Diagnosis not present

## 2018-10-09 DIAGNOSIS — M25561 Pain in right knee: Secondary | ICD-10-CM

## 2018-10-09 DIAGNOSIS — M25521 Pain in right elbow: Secondary | ICD-10-CM | POA: Diagnosis not present

## 2018-10-09 MED ORDER — DICLOFENAC SODIUM 50 MG PO TBEC: 50.0000 mg | DELAYED_RELEASE_TABLET | Freq: Two times a day (BID) | ORAL | 1 refills | Status: DC

## 2018-10-09 MED ORDER — VALSARTAN 40 MG PO TABS: 40.0000 mg | ORAL_TABLET | Freq: Every day | ORAL | 1 refills | Status: DC

## 2018-10-09 NOTE — Progress Notes (Signed)
Subjective:    Patient ID: Brittany Pratt, female    DOB: 14-Mar-1976, 43 y.o.   MRN: [id number]  Brittany Pratt is a 43 y.o. female presenting on 10/09/2018 for Hypertension and Knee Pain (intermittent bilateral knee pain and swelling. Also bilateral elbow pain. )  HPI Hypertension - She is not checking BP at home or outside of clinic.    - Current medications: valsartan 40 mg once daily, tolerating well without side effects - She is not currently symptomatic. - Pt denies headache, lightheadedness, dizziness, changes in vision, chest tightness/pressure, palpitations, leg swelling, sudden loss of speech or loss of consciousness. - She  reports no regular exercise routine outside of work. - Her diet is moderate in salt, moderate in fat, and moderate in carbohydrates.   Knee pain, Elbow pain Patient has more pain with walking.  Stands for 8 hours working when she lift boxes to put on table, feels pain in elbows.  Patient notes there are monitors for correct lifting at work and they occasionally correct her movements. - Ibuprofen is no longer helping the pain.  Saw [facility] several years ago.   - Patient works in Psychologist, educational.  She is the only one in her family working at this time, so she does not want to take time off if possible.  Social History   Tobacco Use  . Smoking status: Never Smoker  . Smokeless tobacco: Never Used  Substance Use Topics  . Alcohol use: No  . Drug use: No    Review of Systems Per HPI unless specifically indicated above     Objective:    BP 136/74 (BP Location: Left Arm, Patient Position: Sitting, Cuff Size: Normal)   Pulse 79   Ht 5' (1.524 m)   Wt 206 lb 9.6 oz (93.7 kg)   BMI 40.35 kg/m   Wt Readings from Last 3 Encounters:  10/09/18 206 lb 9.6 oz (93.7 kg)  05/27/18 194 lb (88 kg)  04/25/18 195 lb 12.8 oz (88.8 kg)    Physical Exam Vitals signs reviewed.  Constitutional:      General: She is  not in acute distress.    Appearance: She is well-developed.  HENT:     Head: Normocephalic and atraumatic.  Cardiovascular:     Rate and Rhythm: Normal rate and regular rhythm.     Pulses:          Radial pulses are 2+ on the right side and 2+ on the left side.       Posterior tibial pulses are 1+ on the right side and 1+ on the left side.     Heart sounds: Normal heart sounds, S1 normal and S2 normal.  Pulmonary:     Effort: Pulmonary effort is normal. No respiratory distress.     Breath sounds: Normal breath sounds and air entry.  Musculoskeletal:        General: Swelling present.     Right lower leg: No edema.     Left lower leg: No edema.     Comments: RIGHT knee medial and meniscus  Burning pain RIGHT knee medial and lateral and meniscus pain.  Varus stress causes pain RIGHT, Valgus stress causes pain LEFT Limited ROM, crepitus with all movement. Patient has pain with all movement and takes significant time to stand from chair.  Elbows Lateral pain RIGHT, medial > lateral pain on LEFT Full ROM  Skin:    General: Skin is warm and dry.  Capillary Refill: Capillary refill takes less than 2 seconds.  Neurological:     General: No focal deficit present.     Mental Status: She is alert and oriented to person, place, and time. Mental status is at baseline.  Psychiatric:        Attention and Perception: Attention normal.        Mood and Affect: Mood and affect normal.        Behavior: Behavior normal. Behavior is cooperative.      Results for orders placed or performed during the hospital encounter of 05/27/18  Rapid Influenza A&B Antigens ([facility] only)   Specimen: Respiratory  Result Value Ref Range   Influenza A ([facility]) NEGATIVE NEGATIVE   Influenza B ([facility]) NEGATIVE NEGATIVE  Rapid Strep Screen ([facility] ONLY)   Specimen: Throat; Other  Result Value Ref Range   Streptococcus, Group A Screen (Direct) NEGATIVE NEGATIVE  Culture, group A strep   Specimen: Throat   Result Value Ref Range   Specimen Description      THROAT Performed at [facility], [address] [place], [place] [address]    Special Requests      NONE Reflexed from [id number] Performed at [facility], [address] [place], [place] [address]    Culture      NO GROUP A STREP (S.PYOGENES) ISOLATED Performed at [facility], [address] [place], [place] [address]    Report Status 05/30/2018 FINAL       Assessment & Plan:   Problem List Items Addressed This Visit      Cardiovascular and Mediastinum   Essential hypertension Controlled hypertension.  BP goal < 130/80.  Pt is working on lifestyle modifications.  Taking medications tolerating well without side effects. No current complications.  Plan: 1. Continue taking valsartan 40 mg once daily 2. Obtain labs today  3. Encouraged heart healthy diet and increasing exercise to 30 minutes most days of the week. 4. Check BP 1-2 x per week at home, keep log, and bring to clinic at next appointment. 5. Follow up 3 months.     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  Medial and lateral epicondylitis of elbows.  Both conditions likely due to overuse injuries.  Patient is not currently interested in worker's compensation.    Plan: 1. STOP ibuprofen and all other NSAIDs 2. Considered prednisone, but will choose to treat with diclofenac 50 mg bid x 14 days, then prn first. 3. Patient likely to need physical therapy, possible cortisone injections. 4. Patient chooses to defer xrays to  Orthopedics. 5. Referral placed to Orthopedic surgery 6. Follow-up 2-4 weeks prn.  Meds ordered this encounter  Medications  . diclofenac (VOLTAREN) 50 MG EC tablet    Sig: Take 1 tablet (50 mg total) by mouth 2 (two) times daily.    Dispense:  60 tablet    Refill:  1    Order Specific Question:   Supervising Provider  Answer:   Smitty CordsKARAMALEGOS, ALEXANDER J [[id number]]  . valsartan (DIOVAN) 40 MG tablet    Sig: Take 1 tablet (40 mg total) by mouth daily.    Dispense:  90 tablet    Refill:  1    Order Specific Question:   Supervising Provider    Answer:   Smitty CordsKARAMALEGOS, ALEXANDER J [[id number]]    Follow up plan: Return in about 6 months (around 04/11/2019) for hypertension.  Wilhelmina McardleLauren Nyeshia Mysliwiec, DNP, AGPCNP-BC Adult Gerontology Primary Care Nurse Practitioner [facility] 10/09/2018, 3:55 PM

## 2018-10-09 NOTE — Patient Instructions (Addendum)
Brittany Pratt,   Thank you for coming in to clinic today.  1. STOP all ibuprofen and Aleeve - May continue only Tylenol over the counter - START diclofenac 50 mg twice daily.  This should help your knee and elbow pain.  2. Referral is placed to [facility] for your joints.    Please schedule a follow-up appointment with Cassell Smiles, AGNP. Return in about 6 months (around 04/11/2019) for hypertension.  If you have any other questions or concerns, please feel free to call the clinic or send a message through [place]. You may also schedule an earlier appointment if necessary.  You will receive a survey after today's visit either digitally by e-mail or paper by [employer]. Your experiences and feedback matter to [place].  Please respond so we know how we are doing as we provide care for you.   Cassell Smiles, DNP, AGNP-BC Adult Gerontology Nurse Practitioner [facility]    Dolor de rodilla crnico en los adultos Chronic Knee Pain, Adult El dolor de rodilla crnico es el dolor en una o en ambas rodillas que dura ms de 3 meses. Los sntomas de dolor de rodilla crnico pueden incluir hinchazn, rigidez y Tree surgeon. El desgaste de la articulacin de la rodilla (osteoartritis) relacionado con la edad es la causa ms frecuente de dolor de rodilla crnico. Otras causas posibles incluyen lo siguiente:  Una enfermedad a largo plazo relacionada con el sistema inmunitario que causa inflamacin de la rodilla (artritis reumatoide). Por lo general, afecta ambas rodillas.  Artritis inflamatoria, como gota o pseudogota.  Una lesin en la rodilla que causa artritis.  Una lesin en la rodilla que daa los ligamentos. Los ligamentos son tejidos fuertes que [employer] s.  Rodilla de corredor o dolor detrs de la rtula. El tratamiento para el dolor de rodilla crnico depende de su causa. Los tratamientos principales para el dolor de rodilla crnico son la  fisioterapia y la prdida de [place]. Esta afeccin tambin puede tratarse con medicamentos, inyecciones, una rodillera o un dispositivo ortopdico, y el uso de [place]. Adems, puede recomendarse el tratamiento con reposo, hielo, compresin (presin) y elevacin (RHCE). Siga estas instrucciones en su casa: Si tiene una rodillera o un dispositivo ortopdico:   selos como se lo haya indicado el mdico. Quteselos solamente como se lo haya indicado el mdico.  Afljelos si siente hormigueo en los dedos de los pies, si se le entumecen, o se le enfran y se tornan de Optician, dispensing.  Mantngalos limpios.  Si la rodillera o el dispositivo ortopdico no son impermeables: ? No deje que se mojen. ? [place], si el mdico se lo permite, o cbralo con un envoltorio hermtico cuando tome un bao de inmersin o [place]. Control del dolor, la rigidez y la hinchazn      Si se lo indican, aplique calor en la zona afectada con la frecuencia que le haya indicado el mdico. Use la fuente de calor que el mdico le recomiende, como una compresa de calor hmedo o una almohadilla trmica. ? Si [place] una rodillera o un dispositivo ortopdico, quteselo segn las indicaciones del mdico. ? Colquese una toalla entre la piel y la fuente de Freight forwarder. ? Aplique calor durante 20 a 12mnutos. ? Retire la fuente de calor si la piel se pone de color rojo brillante. Esto es especialmente importante si no puede sentir dolor, calor o fro. Puede correr un riesgo mayor de sufrir quemaduras.  Si se lo  indican, aplique hielo sobre la zona afectada. ? Si [place] una rodillera o un dispositivo ortopdico, quteselo segn las indicaciones del mdico. ? Ponga el hielo en una bolsa plstica. ? Coloque una [employer] piel y Therapist, nutritional. ? Coloque el hielo durante 45mnutos, 2 a 3veces por da.  Mueva los dedos del pie con frecuencia para reducir la rigidez y la hinchazn.  Cuando est sentado o acostado, levante (eleve) la zona  lesionada por encima del nivel del corazn. Actividad  Evite las actividades en las que ambos pies no estn en contacto con el piso al mismo tiempo (actividades de alto impacto). Algunos ejemplos son correr, sArt therapistsoga y hacer saltos de tijera.  Retome sus actividades normales segn lo indicado por el mdico. Pregntele al mdico qu actividades son seguras para usted.  Siga el plan de ejercicios que el mdico dise para usted. El mdico puede recomendarle lo siguiente: ? EProduct/process development scientistlas actividades que empeoren el dolor de rodilla. Esto puede exigirle que modifique sus rutinas de ejercicio, participacin en deportes u obligaciones laborales. ? Usar calzado con suelas acolchonadas. ? Evitar las actividades o los deportes de alto impacto que requieran correr y cQuarry managerde direccin sbitamente. ? Realice fisioterapia como se lo haya indicado el mdico. La fisioterapia est planificada para satisfacer sus necesidades y capacidades. Puede incluir ejercicios para la fuerza, la flexibilidad, la estabilidad y la resistencia. ? Haga ejercicios que mejoren el equilibrio y la fuerza, cGlenburntai chi y el yoga.  No apoye el peso del cuerpo sNIKEextremidad lAltria Groupque lo autorice el mdico. Use muletas, un bastn o un andador, segn se lo haya indicado el mdico. Instrucciones generales  Use los medicamentos de venta libre y los recetados solamente como se lo haya indicado el mdico.  Baje de peso si es necesario. Perder incluso un poco de peso puede reducir el dolor de rodilla. Pregntele al mdico cul es su peso ideal y cmo aHorticulturist, commercialsin riesgos. Un experto en alimentacin (nutricionista) puede ayudarlo a planificar sus comidas.  No consuma ningn producto que contenga nicotina o tabaco, como cigarrillos, cigarrillos electrnicos y tabaco de mHigher education careers adviser Estos pueden retrasar la recuperacin. Si necesita ayuda para dejar de fumar, consulte al mdico.  Concurra a todas las visitas de seguimiento  como se lo haya indicado el mdico. Esto es importante. Comunquese con un mdico si:  Tiene un dolor de rodilla que no mejora o que eAtkins  No puede realizar los ejercicios de fisioterapia debido al dolor de rodilla. Solicite ayuda inmediatamente si:  La rodilla se hincha y la hinchazn empeora.  No puede mover la rodilla.  Siente dolor intenso en la rodilla. Resumen  El dolor de rodilla que dura ms de 3 meses se considera dolor de rodilla crnico.  Los tratamientos principales para el dolor de rodilla crnico son la fisioterapia y la prdida de pBrookville Tambin es posible que deba tomar medicamentos, usar una rodillera o un dispositivo ortopdico, usar muletas y aplicarse hielo o calor en la rodilla.  Perder incluso un poco de peso puede reducir el dolor de rodilla. Pregntele al mdico cul es su peso ideal y cmo aHorticulturist, commercialsin riesgos. Un experto en alimentacin (nutricionista) puede ayudarlo a planificar sus comidas.  Trabaje con un fisioterapeuta para crear un programa de ejercicios seguros, segn lo indique el mdico. Esta informacin no tiene cMarine scientistel consejo del mdico. Asegrese de hacerle al mdico cualquier pregunta que tenga. Document Released: 06/20/2018 Document Revised: 06/20/2018 Document  Reviewed: 06/20/2018 Elsevier Patient Education  [place]ler tenista Tennis Elbow  El codo de tenista (epicondilitis lateral) es la inflamacin de los tendones en la zona externa del Joycie Peek, cerca del codo. Los tendones son tejidos que Engineer, building services msculo al [employer]. Cuando tiene codo de [place], Research officer, trade union a los tendones que se Programmer, systems para Quarry manager la [place] y Actor la mano [place] arriba. La inflamacin se produce en la parte inferior del hueso del brazo (hmero), en la que los tendones se conectan a los huesos (epicndilo lateral). A menudo, el codo de [place] afecta a aquellos que juegan al tenis, Armed forces training and education officer cualquier persona puede tener la  afeccin si extiende la Bier o gira el antebrazo repetidas veces. Cules son las causas? Esta afeccin generalmente se debe a que extiende la mueca o gira el Management consultant y [place] las manos de [place] reiterada. Puede ser consecuencia de la prctica de deportes o la realizacin de tareas que exigen movimientos repetitivos del Management consultant. En algunos casos, la afeccin puede deberse a una lesin repentina. Qu incrementa el riesgo? Tiene ms probabilidades de tener codo de tenista si juega al tenis o practica otro deporte con raqueta. Adems, si [place] frecuentemente las manos para trabajar. Adems de aquellos que juegan al tenis, otras personas con mayor riesgo, incluyen:  Msicos.  Carpinteros, pintores y plomeros.  Cocineros.  Cajeros.  Trabajadores de fbricas.  Trabajadores de Programme researcher, broadcasting/film/video.  Carniceros.  Usuarios de computadoras. Cules son los signos o los sntomas? Los sntomas de esta afeccin incluyen:  Dolor y sensibilidad a la palpacin en su antebrazo y la parte externa del codo. El Social research officer, government puede solo sentirse cuando use el brazo o puede sentirse todo Physiological scientist.  Una sensacin de ardor que comienza en la zona del codo y se extiende por el Management consultant.  Un agarre dbil en la mano. Cmo se diagnostica? Esta afeccin se puede diagnosticar en funcin de lo siguiente:  Los sntomas y los antecedentes mdicos.  Un examen fsico.  Radiografas.  Resonancia magntica (RM). Cmo se trata? Descansar el brazo y Midwife hielo es Sports coach. El mdico tambin podr indicar:  Medicamentos para reducir Conservation officer, historic buildings y la inflamacin. Estos pueden administrase en forma de pldora, gel tpico, o inyecciones de medicamentos con corticoesteroides (cortisona).  Una correa para codo para reducir la presin sobre la zona.  Fisioterapia. Este tratamiento puede incluir masajes o ejercicios.  Un dispositivo ortopdico para limitar los movimientos del codo que le causen  sntomas. Si estos tratamientos no ayudan a [employer], el mdico puede recomendar una ciruga para extirpar el [employer] daado y Location manager a unir el msculo sano al [employer]. Siga estas instrucciones en su casa: Actividad  Descanse el codo y la [place], y evite las actividades que le causen sntomas, Civil Service fast streamer se lo haya indicado el mdico.  Haga los ejercicios de fisioterapia como se lo hayan indicado.  Si levanta un objeto, hgalo con la palma de la mano [place] arriba. Esto reduce la tensin en su codo. Estilo de vida  Si el codo de [place] se debe a los deportes, revise el equipo y Chief Strategy Officer de lo siguiente: ? Que lo [place] correctamente. ? Que es la opcin ms [place] para usted.  Si el codo de [place] se debe a su trabajo o al uso de una computadora, tmese descansos frecuentes para Publishing rights manager. Hable con su jefe de personal [place] controlar su afeccin en [place]. Si tiene un dispositivo  ortopdico:  Use el dispositivo ortopdico o la correa como se lo haya indicado el mdico. Quteselos solamente como se lo haya indicado el mdico.  Afloje el dispositivo ortopdico si los dedos de las manos se le adormecen, siente hormigueos o se le enfran y se tornan de Optician, dispensing.  Mantenga limpio el dispositivo ortopdico.  Si el dispositivo ortopdico no es impermeable, pregunte si puede retirarlo para baarse. Si debe tener puesto el dispositivo ortopdico mientras se baa: ? No deje que se moje. ? Cbralo con un envoltorio hermtico cuando tome un bao de inmersin o [place]. Instrucciones generales   Si se lo indican, aplique hielo sobre la zona del dolor: ? Ponga el hielo en una bolsa plstica. ? Coloque una [employer] piel y Therapist, nutritional. ? Coloque el hielo durante 26mnutos, 2 a 3veces por da.  Tome los medicamentos de venta libre y los recetados solamente como se lo haya indicado el mdico.  CConsulting civil engineera todas las visitas de seguimiento como se lo haya  indicado el mdico. Esto es importante. Comunquese con un mdico si:  Tiene un dolor que empeora o que no mejora con el tHull  Tiene adormecimiento o debilidad en el antebrazo, la mano o los dedos de la [place] Resumen  El codo de t[place](epicondilitis lateral) es la inflamacin de los tendones en la zona externa del aJoycie Peek cerca del codo.  Los sntomas frecuentes incluyen dolor y sensibilidad a la palpacin en el aManagement consultanty la parte externa del codo.  Generalmente, esta afeccin se debe a que extiende la mueca o gira el aManagement consultanty uCanadalas manos de mPurple Sagereiterada.  El pEngineer, maintenance (IT)es, a menudo, dProduction assistant, radioel brazo y aMidwifehielo. Otros tratamientos pueden incluir tomar medicamentos, hacer ejercicios de fisioterapia, usar un dispositivo ortopdico o correa, o someterse a uQatar Esta informacin no tiene cMarine scientistel consejo del mdico. Asegrese de hacerle al mdico cualquier pregunta que tenga. Document Released: 03/01/2005 Document Revised: 12/30/2017 Document Reviewed: 12/30/2017 Elsevier Patient Education  2020 [employer]

## 2018-10-11 DIAGNOSIS — I10 Essential (primary) hypertension: Secondary | ICD-10-CM | POA: Diagnosis not present

## 2018-10-12 ENCOUNTER — Telehealth: Payer: Self-pay | Admitting: Nurse Practitioner

## 2018-10-12 LAB — COMPREHENSIVE METABOLIC PANEL
ALT: 17 IU/L (ref 0–32)
AST: 19 IU/L (ref 0–40)
Albumin/Globulin Ratio: 2 (ref 1.2–2.2)
Albumin: 4.8 g/dL (ref 3.8–4.8)
Alkaline Phosphatase: 83 IU/L (ref 39–117)
BUN/Creatinine Ratio: 21 (ref 9–23)
BUN: 16 mg/dL (ref 6–24)
Bilirubin Total: 0.3 mg/dL (ref 0.0–1.2)
CO2: 21 mmol/L (ref 20–29)
Calcium: 9.6 mg/dL (ref 8.7–10.2)
Chloride: 99 mmol/L (ref 96–106)
Creatinine, Ser: 0.76 mg/dL (ref 0.57–1.00)
GFR calc Af Amer: 113 mL/min/{1.73_m2} (ref >59)
GFR calc non Af Amer: 98 mL/min/{1.73_m2} (ref >59)
Globulin, Total: 2.4 g/dL (ref 1.5–4.5)
Glucose: 118 mg/dL — ABNORMAL HIGH (ref 65–99)
Potassium: 4.1 mmol/L (ref 3.5–5.2)
Sodium: 138 mmol/L (ref 134–144)
Total Protein: 7.2 g/dL (ref 6.0–8.5)

## 2018-10-12 NOTE — Telephone Encounter (Signed)
She did provide fax number [phone]) so that she wouldn't have to come pick up the letter and it could be faxed directly, her supervisors are aware that a letter could be sent and are willing to accomdate her needs but need to have a written letter by her doctor.

## 2018-10-12 NOTE — Telephone Encounter (Signed)
Pts wants to have a note for work stating that she has knee problems and if they could allow her to park at a different area since where she parks at now she is having and go up and down the stairs is causing her knees to get swollen and painful. She mentioned the knee pain at her last visit and the provider mention writing her a letter for her to be out of work for a few but she thinks it would be better to just have a letter where she can have accommodations so she doesn't have to go up and down the stairs. Please advise.

## 2018-10-13 NOTE — Telephone Encounter (Signed)
Letter is written. Please fax.

## 2018-10-19 DIAGNOSIS — M25561 Pain in right knee: Secondary | ICD-10-CM | POA: Diagnosis not present

## 2018-10-19 DIAGNOSIS — M25562 Pain in left knee: Secondary | ICD-10-CM | POA: Diagnosis not present

## 2018-10-19 DIAGNOSIS — M7702 Medial epicondylitis, left elbow: Secondary | ICD-10-CM | POA: Diagnosis not present

## 2018-10-19 DIAGNOSIS — M1711 Unilateral primary osteoarthritis, right knee: Secondary | ICD-10-CM | POA: Diagnosis not present

## 2018-11-16 DIAGNOSIS — M25562 Pain in left knee: Secondary | ICD-10-CM | POA: Diagnosis not present

## 2018-11-16 DIAGNOSIS — M1712 Unilateral primary osteoarthritis, left knee: Secondary | ICD-10-CM | POA: Diagnosis not present

## 2018-12-07 DIAGNOSIS — M170 Bilateral primary osteoarthritis of knee: Secondary | ICD-10-CM | POA: Diagnosis not present

## 2018-12-13 DIAGNOSIS — M25562 Pain in left knee: Secondary | ICD-10-CM | POA: Diagnosis not present

## 2018-12-14 DIAGNOSIS — M170 Bilateral primary osteoarthritis of knee: Secondary | ICD-10-CM | POA: Diagnosis not present

## 2018-12-19 DIAGNOSIS — M170 Bilateral primary osteoarthritis of knee: Secondary | ICD-10-CM | POA: Diagnosis not present

## 2018-12-21 ENCOUNTER — Other Ambulatory Visit: Payer: Self-pay | Admitting: Nurse Practitioner

## 2018-12-21 DIAGNOSIS — G8929 Other chronic pain: Secondary | ICD-10-CM

## 2018-12-21 DIAGNOSIS — M25562 Pain in left knee: Secondary | ICD-10-CM

## 2018-12-22 DIAGNOSIS — M170 Bilateral primary osteoarthritis of knee: Secondary | ICD-10-CM | POA: Diagnosis not present

## 2018-12-23 DIAGNOSIS — M25562 Pain in left knee: Secondary | ICD-10-CM | POA: Diagnosis not present

## 2018-12-26 DIAGNOSIS — M170 Bilateral primary osteoarthritis of knee: Secondary | ICD-10-CM | POA: Diagnosis not present

## 2018-12-29 DIAGNOSIS — M170 Bilateral primary osteoarthritis of knee: Secondary | ICD-10-CM | POA: Diagnosis not present

## 2019-01-02 DIAGNOSIS — Z01818 Encounter for other preprocedural examination: Secondary | ICD-10-CM | POA: Diagnosis not present

## 2019-01-02 DIAGNOSIS — S83242A Other tear of medial meniscus, current injury, left knee, initial encounter: Secondary | ICD-10-CM | POA: Diagnosis not present

## 2019-01-03 ENCOUNTER — Other Ambulatory Visit: Payer: Self-pay | Admitting: Orthopedic Surgery

## 2019-01-08 ENCOUNTER — Other Ambulatory Visit: Payer: Self-pay

## 2019-01-08 ENCOUNTER — Other Ambulatory Visit
Admission: RE | Admit: 2019-01-08 | Discharge: 2019-01-08 | Disposition: A | Discharge status: Home / Self Care | Payer: BC Managed Care – PPO | Source: Ambulatory Visit | Attending: Orthopedic Surgery | Admitting: Orthopedic Surgery

## 2019-01-08 ENCOUNTER — Encounter: Payer: Self-pay | Admitting: *Deleted

## 2019-01-08 DIAGNOSIS — Z01812 Encounter for preprocedural laboratory examination: Secondary | ICD-10-CM | POA: Diagnosis not present

## 2019-01-08 DIAGNOSIS — Z20828 Contact with and (suspected) exposure to other viral communicable diseases: Secondary | ICD-10-CM | POA: Insufficient documentation

## 2019-01-08 LAB — SARS CORONAVIRUS 2 (TAT 6-24 HRS): SARS Coronavirus 2: NEGATIVE

## 2019-01-11 ENCOUNTER — Ambulatory Visit: Payer: BC Managed Care – PPO | Admitting: Anesthesiology

## 2019-01-11 ENCOUNTER — Other Ambulatory Visit: Payer: Self-pay

## 2019-01-11 ENCOUNTER — Encounter
Admission: RE | Disposition: A | Discharge status: Home / Self Care | Payer: Self-pay | Source: Home / Self Care | Attending: Orthopedic Surgery

## 2019-01-11 ENCOUNTER — Ambulatory Visit
Admission: RE | Admit: 2019-01-11 | Discharge: 2019-01-11 | Disposition: A | Discharge status: Home / Self Care | Payer: BC Managed Care – PPO | Attending: Orthopedic Surgery | Admitting: Orthopedic Surgery

## 2019-01-11 DIAGNOSIS — S83242A Other tear of medial meniscus, current injury, left knee, initial encounter: Secondary | ICD-10-CM | POA: Diagnosis not present

## 2019-01-11 DIAGNOSIS — X58XXXA Exposure to other specified factors, initial encounter: Secondary | ICD-10-CM | POA: Diagnosis not present

## 2019-01-11 DIAGNOSIS — Z79899 Other long term (current) drug therapy: Secondary | ICD-10-CM | POA: Insufficient documentation

## 2019-01-11 DIAGNOSIS — M2242 Chondromalacia patellae, left knee: Secondary | ICD-10-CM | POA: Diagnosis not present

## 2019-01-11 DIAGNOSIS — M94262 Chondromalacia, left knee: Secondary | ICD-10-CM | POA: Diagnosis not present

## 2019-01-11 DIAGNOSIS — I10 Essential (primary) hypertension: Secondary | ICD-10-CM | POA: Insufficient documentation

## 2019-01-11 DIAGNOSIS — S83242S Other tear of medial meniscus, current injury, left knee, sequela: Secondary | ICD-10-CM | POA: Diagnosis not present

## 2019-01-11 HISTORY — DX: Essential (primary) hypertension: I10

## 2019-01-11 HISTORY — PX: KNEE ARTHROSCOPY: SHX127

## 2019-01-11 SURGERY — ARTHROSCOPY, KNEE
Anesthesia: General | Site: Knee | Laterality: Left

## 2019-01-11 MED ORDER — LACTATED RINGERS IV SOLN
INTRAVENOUS | Status: DC
  Administered 2019-01-11: 12:00:00 via INTRAVENOUS

## 2019-01-11 MED ORDER — ONDANSETRON HCL 4 MG/2ML IJ SOLN: 4.0000 mg | Freq: Four times a day (QID) | INTRAMUSCULAR | Status: DC | PRN

## 2019-01-11 MED ORDER — ONDANSETRON HCL 4 MG/2ML IJ SOLN
4.0000 mg | Freq: Once | INTRAMUSCULAR | Status: AC | PRN
  Administered 2019-01-11: 4 mg via INTRAVENOUS

## 2019-01-11 MED ORDER — ACETAMINOPHEN 500 MG PO TABS: 500.0000 mg | ORAL_TABLET | Freq: Four times a day (QID) | ORAL | Status: DC

## 2019-01-11 MED ORDER — METOCLOPRAMIDE HCL 5 MG PO TABS: 5.0000 mg | ORAL_TABLET | Freq: Three times a day (TID) | ORAL | Status: DC | PRN

## 2019-01-11 MED ORDER — PROPOFOL 10 MG/ML IV BOLUS
INTRAVENOUS | Status: DC | PRN
  Administered 2019-01-11: 150 mg via INTRAVENOUS

## 2019-01-11 MED ORDER — DOCUSATE SODIUM 100 MG PO CAPS: 100.0000 mg | ORAL_CAPSULE | Freq: Every day | ORAL | 2 refills | Status: AC | PRN

## 2019-01-11 MED ORDER — ONDANSETRON HCL 4 MG PO TABS: 4.0000 mg | ORAL_TABLET | Freq: Four times a day (QID) | ORAL | Status: DC | PRN

## 2019-01-11 MED ORDER — CEFAZOLIN SODIUM-DEXTROSE 2-4 GM/100ML-% IV SOLN
2.0000 g | INTRAVENOUS | Status: AC
  Administered 2019-01-11: 2 g via INTRAVENOUS

## 2019-01-11 MED ORDER — KETOROLAC TROMETHAMINE 30 MG/ML IJ SOLN: 30.0000 mg | Freq: Once | INTRAMUSCULAR | Status: DC | PRN

## 2019-01-11 MED ORDER — ACETAMINOPHEN 325 MG PO TABS: 325.0000 mg | ORAL_TABLET | ORAL | Status: DC | PRN

## 2019-01-11 MED ORDER — CHLORHEXIDINE GLUCONATE 4 % EX LIQD: 60.0000 mL | Freq: Once | CUTANEOUS | Status: DC

## 2019-01-11 MED ORDER — HYDROCODONE-ACETAMINOPHEN 5-325 MG PO TABS: 1.0000 | ORAL_TABLET | ORAL | Status: DC | PRN

## 2019-01-11 MED ORDER — LACTATED RINGERS IV SOLN
INTRAVENOUS | Status: DC | PRN
  Administered 2019-01-11: 13:00:00 3000 mL

## 2019-01-11 MED ORDER — OXYCODONE HCL 5 MG PO TABS: 5.0000 mg | ORAL_TABLET | Freq: Once | ORAL | Status: AC | PRN

## 2019-01-11 MED ORDER — GLYCOPYRROLATE 0.2 MG/ML IJ SOLN
INTRAMUSCULAR | Status: DC | PRN
  Administered 2019-01-11: 0.1 mg via INTRAVENOUS

## 2019-01-11 MED ORDER — KETOROLAC TROMETHAMINE 15 MG/ML IJ SOLN: 15.0000 mg | Freq: Four times a day (QID) | INTRAMUSCULAR | Status: DC

## 2019-01-11 MED ORDER — TRIAMCINOLONE ACETONIDE 40 MG/ML IJ SUSP
INTRAMUSCULAR | Status: DC | PRN
  Administered 2019-01-11: 40 mg via INTRA_ARTICULAR

## 2019-01-11 MED ORDER — DEXAMETHASONE SODIUM PHOSPHATE 4 MG/ML IJ SOLN
INTRAMUSCULAR | Status: DC | PRN
  Administered 2019-01-11: 4 mg via INTRAVENOUS

## 2019-01-11 MED ORDER — ONDANSETRON HCL 4 MG/2ML IJ SOLN
INTRAMUSCULAR | Status: DC | PRN
  Administered 2019-01-11: 4 mg via INTRAVENOUS

## 2019-01-11 MED ORDER — LIDOCAINE HCL (CARDIAC) PF 100 MG/5ML IV SOSY
PREFILLED_SYRINGE | INTRAVENOUS | Status: DC | PRN
  Administered 2019-01-11: 50 mg via INTRATRACHEAL

## 2019-01-11 MED ORDER — HYDROCODONE-ACETAMINOPHEN 5-325 MG PO TABS: 1.0000 | ORAL_TABLET | ORAL | 0 refills | Status: DC | PRN

## 2019-01-11 MED ORDER — HYDROMORPHONE HCL 1 MG/ML IJ SOLN
0.2500 mg | INTRAMUSCULAR | Status: DC | PRN
  Administered 2019-01-11 (×2): 0.5 mg via INTRAVENOUS

## 2019-01-11 MED ORDER — LACTATED RINGERS IV SOLN: INTRAVENOUS | Status: DC

## 2019-01-11 MED ORDER — ACETAMINOPHEN 160 MG/5ML PO SOLN: 325.0000 mg | ORAL | Status: DC | PRN

## 2019-01-11 MED ORDER — SCOPOLAMINE 1 MG/3DAYS TD PT72
1.0000 | MEDICATED_PATCH | TRANSDERMAL | Status: DC
  Administered 2019-01-11: 1.5 mg via TRANSDERMAL

## 2019-01-11 MED ORDER — MORPHINE SULFATE (PF) 2 MG/ML IV SOLN: 0.5000 mg | INTRAVENOUS | Status: DC | PRN

## 2019-01-11 MED ORDER — OXYCODONE HCL 5 MG/5ML PO SOLN
5.0000 mg | Freq: Once | ORAL | Status: AC | PRN
  Administered 2019-01-11: 5 mg via ORAL

## 2019-01-11 MED ORDER — HYDROCODONE-ACETAMINOPHEN 7.5-325 MG PO TABS: 1.0000 | ORAL_TABLET | ORAL | Status: DC | PRN

## 2019-01-11 MED ORDER — ROPIVACAINE HCL 5 MG/ML IJ SOLN
INTRAMUSCULAR | Status: DC | PRN
  Administered 2019-01-11: 20 mL

## 2019-01-11 MED ORDER — KETOROLAC TROMETHAMINE 30 MG/ML IJ SOLN
30.0000 mg | Freq: Once | INTRAMUSCULAR | Status: AC
  Administered 2019-01-11: 30 mg via INTRAVENOUS

## 2019-01-11 MED ORDER — ACETAMINOPHEN 325 MG PO TABS: 325.0000 mg | ORAL_TABLET | Freq: Four times a day (QID) | ORAL | Status: DC | PRN

## 2019-01-11 MED ORDER — METOCLOPRAMIDE HCL 5 MG/ML IJ SOLN: 5.0000 mg | Freq: Three times a day (TID) | INTRAMUSCULAR | Status: DC | PRN

## 2019-01-11 MED ORDER — MIDAZOLAM HCL 5 MG/5ML IJ SOLN
INTRAMUSCULAR | Status: DC | PRN
  Administered 2019-01-11: 2 mg via INTRAVENOUS

## 2019-01-11 MED ORDER — BUPIVACAINE-EPINEPHRINE 0.5% -1:200000 IJ SOLN
INTRAMUSCULAR | Status: DC | PRN
  Administered 2019-01-11: 15 mL

## 2019-01-11 MED ORDER — FENTANYL CITRATE (PF) 100 MCG/2ML IJ SOLN
INTRAMUSCULAR | Status: DC | PRN
  Administered 2019-01-11: 50 ug via INTRAVENOUS
  Administered 2019-01-11 (×2): 25 ug via INTRAVENOUS
  Administered 2019-01-11: 50 ug via INTRAVENOUS
  Administered 2019-01-11 (×2): 25 ug via INTRAVENOUS

## 2019-01-11 SURGICAL SUPPLY — 35 items
ADAPTER IRRIG TUBE 2 SPIKE SOL (ADAPTER) ×2 IMPLANT
BLADE INCISOR PLUS 4.5 (BLADE) ×2 IMPLANT
BLADE SHAVER 4.5 DBL SERAT CV (CUTTER) ×2 IMPLANT
BLADE SURG SZ11 CARB STEEL (BLADE) ×2 IMPLANT
BRUSH SCRUB EZ  4% CHG (MISCELLANEOUS) ×2
BRUSH SCRUB EZ 4% CHG (MISCELLANEOUS) ×2 IMPLANT
CHLORAPREP W/TINT 26 (MISCELLANEOUS) ×2 IMPLANT
COOLER POLAR GLACIER W/PUMP (MISCELLANEOUS) ×2 IMPLANT
COVER LIGHT HANDLE UNIVERSAL (MISCELLANEOUS) ×4 IMPLANT
DRAPE U-SHAPE 48X52 POLY STRL (PACKS) ×2 IMPLANT
GAUZE SPONGE 4X4 12PLY STRL (GAUZE/BANDAGES/DRESSINGS) ×2 IMPLANT
GLOVE INDICATOR 8.0 STRL GRN (GLOVE) ×2 IMPLANT
GLOVE SURG ORTHO 8.0 STRL STRW (GLOVE) ×2 IMPLANT
GOWN STRL REUS W/ TWL LRG LVL3 (GOWN DISPOSABLE) ×1 IMPLANT
GOWN STRL REUS W/ TWL XL LVL3 (GOWN DISPOSABLE) ×1 IMPLANT
GOWN STRL REUS W/TWL LRG LVL3 (GOWN DISPOSABLE) ×1
GOWN STRL REUS W/TWL XL LVL3 (GOWN DISPOSABLE) ×1
IV LACTATED RINGER IRRG 3000ML (IV SOLUTION) ×1
IV LR IRRIG 3000ML ARTHROMATIC (IV SOLUTION) ×1 IMPLANT
K-WIRE DBL END TROCAR 6X.045 (WIRE) ×2
KIT TURNOVER KIT A (KITS) ×2 IMPLANT
KWIRE DBL END TROCAR 6X.045 (WIRE) ×1 IMPLANT
MANIFOLD NEPTUNE II (INSTRUMENTS) ×2 IMPLANT
MAT ABSORB  FLUID 56X50 GRAY (MISCELLANEOUS) ×1
MAT ABSORB FLUID 56X50 GRAY (MISCELLANEOUS) ×1 IMPLANT
NDL SPNL 20GX3.5 QUINCKE YW (NEEDLE) ×1 IMPLANT
NEEDLE SPNL 20GX3.5 QUINCKE YW (NEEDLE) ×2 IMPLANT
PACK ARTHROSCOPY KNEE (MISCELLANEOUS) ×2 IMPLANT
PAD ABD DERMACEA PRESS 5X9 (GAUZE/BANDAGES/DRESSINGS) ×4 IMPLANT
PAD WRAPON POLAR KNEE (MISCELLANEOUS) IMPLANT
SUT ETHILON 3-0 FS-10 30 BLK (SUTURE) ×2
SUTURE EHLN 3-0 FS-10 30 BLK (SUTURE) ×1 IMPLANT
TUBING ARTHRO INFLOW-ONLY STRL (TUBING) ×2 IMPLANT
WAND WEREWOLF FLOW 90D (MISCELLANEOUS) ×1 IMPLANT
WRAPON POLAR PAD KNEE (MISCELLANEOUS) ×2

## 2019-01-11 NOTE — Anesthesia Postprocedure Evaluation (Signed)
Anesthesia Post Note  Patient: Brittany Pratt  Procedure(s) Performed: ARTHROSCOPY KNEE; partial medial meniscectomy, chondroplasty, partial synovectomy (Left Knee)  Patient location during evaluation: PACU Anesthesia Type: General Level of consciousness: awake and alert Pain management: pain level controlled Vital Signs Assessment: post-procedure vital signs reviewed and stable Respiratory status: spontaneous breathing, nonlabored ventilation, respiratory function stable and patient connected to nasal cannula oxygen Cardiovascular status: blood pressure returned to baseline and stable Postop Assessment: no apparent nausea or vomiting Anesthetic complications: no    Trecia Rogers

## 2019-01-11 NOTE — H&P (Signed)
The patient has been re-examined, and the chart reviewed, and there have been no interval changes to the documented history and physical.  Plan a left knee scope today.  Anesthesia is not consulted regarding a peripheral nerve block for post-operative pain.  The risks, benefits, and alternatives have been discussed at length, and the patient is willing to proceed.     

## 2019-01-11 NOTE — Op Note (Signed)
  PATIENT:  Brittany Pratt  PRE-OPERATIVE DIAGNOSIS:  Y50.354S other tear of medial meniscus current injury left knee  POST-OPERATIVE DIAGNOSIS:  Same  PROCEDURE:  LEFT ARTHROSCOPY KNEE; partial medial meniscectomy, chondroplasty, partial synovectomy  SURGEON:  Kurtis Bushman, MD  ANESTHESIA:   General  PREOPERATIVE INDICATIONS:  Brittany Pratt  43 y.o. female with a diagnosis of S83.242A other tear of medial meniscus current injury left knee who failed conservative management and elected for surgical management.    The risks benefits and alternatives were discussed with the patient preoperatively including the risks of infection, bleeding, nerve injury, knee stiffness, persistent pain, osteoarthritis and the need for further surgery. Medical  risks include DVT and pulmonary embolism, myocardial infarction, stroke, pneumonia, respiratory failure and death. The patient understood these risks and wished to proceed.   OPERATIVE FINDINGS: The suprapatellar pouch, medial and lateral gutters were inspected and found to be free of any loose bodies. The undersurface of the patella and landing zone were inspected and grade Grade 1 chondromalacia was noted. There was no evidence of lateral subluxation. The medial compartment showed a complex tear of the posterior horn, then anterior horn was intact and stable to probing. 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  The notch was inspected and the ACL and PCL were intact and stable to probing. The lateral compartment was entered and minimal degenerative changes were identified. The lateral meniscus had minimal degenerative tearing along the posterior horn. The anterior horn was intact a stable.  OPERATIVE PROCEDURE: Patient was met in the preoperative area. The operative extremity was signed with my initials according the hospital's correct site of surgery protocol.  The patient was brought to the operating  room where they was placed supine on the operative table. General anesthesia was administered. The patient was prepped and draped in a sterile fashion.  A timeout was performed to verify the patient's name, date of birth, medical record number, correct site of surgery correct procedure to be performed. It was also used to verify the patient received antibiotics that all appropriate instruments, and radiographic studies were available in the room. Once all in attendance were in agreement, the case began.  Proposed arthroscopy incisions were drawn out with a surgical marker. These were pre-injected with 0.5% marcaine with epinephrine. An 11 blade was used to establish an inferior lateral and inferomedial portals. The inferomedial portal was created using a 18-gauge spinal needle under direct visualization.  A full diagnostic examination of the knee was performed, please see findings for a complete list of results.  Patient had the meniscal tear treated with a 4-0 resector shaver blade and straight duckbill basket. The meniscus was debrided until a stable rim was achieved. A chondroplasty was performed in all three compartments using the shaver on reverse burr mode. A partial synovectomy was also performed in all three compartments using a 4-0 resector shaver blade and electrocautery.  The knee was then copiously lavaged. All arthroscopic instruments were removed. The 2 arthroscopy portals were closed with 4-0 nylon. A dry sterile and compressive dressing was applied. The patient was brought to the PACU in stable condition. I was scrubbed and present for the entire case and all sharp and instrument counts were correct at the conclusion the case. I spoke with the patient's family postoperatively to let them know the case was performed without complication and the patient was stable in the recovery room.  Kurtis Bushman, MD

## 2019-01-11 NOTE — Transfer of Care (Signed)
Immediate Anesthesia Transfer of Care Note  Patient: Brittany Pratt  Procedure(s) Performed: ARTHROSCOPY KNEE; partial medial meniscectomy, chondroplasty, partial synovectomy (Left Knee)  Patient Location: PACU  Anesthesia Type: General  Level of Consciousness: awake, alert  and patient cooperative  Airway and Oxygen Therapy: Patient Spontanous Breathing and Patient connected to supplemental oxygen  Post-op Assessment: Post-op Vital signs reviewed, Patient's Cardiovascular Status Stable, Respiratory Function Stable, Patent Airway and No signs of Nausea or vomiting  Post-op Vital Signs: Reviewed and stable  Complications: No apparent anesthesia complications

## 2019-01-11 NOTE — Anesthesia Procedure Notes (Signed)
Procedure Name: LMA Insertion Date/Time: 01/11/2019 12:34 PM Performed by: Mayme Genta, CRNA Pre-anesthesia Checklist: Patient identified, Emergency Drugs available, Suction available, Timeout performed and Patient being monitored Patient Re-evaluated:Patient Re-evaluated prior to induction Oxygen Delivery Method: Circle system utilized Preoxygenation: Pre-oxygenation with 100% oxygen Induction Type: IV induction LMA: LMA inserted LMA Size: 4.0 Number of attempts: 1 Placement Confirmation: positive ETCO2 and breath sounds checked- equal and bilateral Tube secured with: Tape

## 2019-01-11 NOTE — Anesthesia Preprocedure Evaluation (Signed)
Anesthesia Evaluation  Patient identified by MRN, date of birth, ID band Patient awake    Reviewed: Allergy & Precautions, H&P , NPO status , Patient's Chart, lab work & pertinent test results, reviewed documented beta blocker date and time   History of Anesthesia Complications (+) PONV and history of anesthetic complications  Airway Mallampati: II  TM Distance: >3 FB Neck ROM: full    Dental no notable dental hx.    Pulmonary neg pulmonary ROS,    Pulmonary exam normal breath sounds clear to auscultation       Cardiovascular Exercise Tolerance: Good hypertension, Normal cardiovascular exam Rhythm:regular Rate:Normal     Neuro/Psych negative neurological ROS  negative psych ROS   GI/Hepatic negative GI ROS, Neg liver ROS,   Endo/Other  negative endocrine ROS  Renal/GU negative Renal ROS  negative genitourinary   Musculoskeletal   Abdominal   Peds  Hematology negative hematology ROS (+)   Anesthesia Other Findings   Reproductive/Obstetrics negative OB ROS                             Anesthesia Physical Anesthesia Plan  ASA: II  Anesthesia Plan: General   Post-op Pain Management:    Induction:   PONV Risk Score and Plan: Scopolamine patch - Pre-op and Ondansetron  Airway Management Planned:   Additional Equipment:   Intra-op Plan:   Post-operative Plan:   Informed Consent: I have reviewed the patients History and Physical, chart, labs and discussed the procedure including the risks, benefits and alternatives for the proposed anesthesia with the patient or authorized representative who has indicated his/her understanding and acceptance.     Dental Advisory Given  Plan Discussed with: CRNA  Anesthesia Plan Comments:         Anesthesia Quick Evaluation

## 2019-01-11 NOTE — Discharge Instructions (Signed)
Post Op Home Instructions for Knee Arthroscopy  1) Do not sit for longer than 1 hour at a time with your leg dangling down.  You should have your legs elevated (higher than your heart) in a recliner chair or couch.  2) You may be up walking around as tolerated but should take periodic breaks to elevate your legs.  Discontinue use of crutches when you feel you are able to walk without pain or a limp.  3) Work on gentle bending and straightening of the knee.  4) You may remove the Ace wrap and dressings two days after surgery.  Place band aids over the incision sites.  5) You may shower after you remove the surgical dressing.  You do not need to cover the incision with plastic wrap.  The incision can get wet, but do not submerge under water.  After your sutures have been removed, you should wait 24 hours before submerging incision under water.  6) Pain medication can cause constipation.  You should increase your fluid intake, increase your intake of high fiber foods and/or take Metamucil as needed for constipation.  7) Continue your physical therapy exercises, as shown at the office, at least twice daily.  You should set up outpatient physical therapy and start within the first week after surgery.  8) Continue to use your Polar Pack continuously for 2-3 days after surgery.  After you remove the surgical dressing, it is a good idea to use your Polar Pack or ice pack for 30 minutes after doing your exercises to reduce swelling.  9) Do not be surprised if you have increased pain at night.  This usually means you have been a little too active during the day and need to reduce your activities.  10) If you develop lower extremity swelling that does not improve after a night of elevation, please call the office.  This could be an early sign of a blood clot.  Please call with any questions at [phone]   Scopolamine skin patches Qu es este medicamento? La ESCOPOLAMINA se [place] para prevenir  las nuseas y vmitos asociados con los mareos provocados por el movimiento, anestesia y [place]. Este medicamento puede ser utilizado para otros usos; si tiene alguna pregunta consulte con su proveedor de atencin mdica o con su farmacutico. MARCAS COMUNES: Transderm Scop Qu le debo informar a mi profesional de la salud antes de tomar este medicamento? Necesitan saber si usted presenta alguno de los siguientes problemas o situaciones: tiene programada una prueba de secrecin gstrica glaucoma enfermedad cardiaca enfermedad renal enfermedad heptica enfermedad pulmonar o respiratoria, como asma trastorno mental enfermedad de la prstata convulsiones problemas estomacales o intestinales problemas al orinar una reaccin alrgica o inusual a la escopolamina, a la atropina, a otros medicamentos, alimentos, colorantes o conservantes si est embarazada o buscando quedar embarazada si est amamantando a un beb Cmo debo [employer]? Este medicamento es solo para uso externo. Siga las instrucciones de la etiqueta del [place]. Use solamente 1 parche por vez. Elija un rea detrs de la oreja, que est limpia, seca, sin cabello y [place] de cortes o irritacin. Limpie el rea con un pauelo de papel seco y limpio. Retire la cubierta plstica del parche para la piel, tratando de no tocar el lado [employer]. No corte los parches. Aplique el parche firmemente sobre el rea elegida, con el lado metlico hacia la piel y el lado bronceado [place]. Una vez que est firme en su lugar, lvese  bien las manos con agua y [place]. Evite que el medicamento entre en contacto con los ojos. Despus de retirar el parche, lvese las manos y el rea detrs de la oreja completamente con agua y [place]. El parche seguir conteniendo algo de medicamento despus de su uso. Para evitar el contacto o la ingestin accidental por parte de nios o mascotas, doble el parche por la mitad con el lado adhesivo junto  y arrjelo a la basura fuera del alcance de nios y Copy. Si necesita usar un segundo parche despus de Environmental health practitioner, colquelo detrs de la otra [place]. Su farmacutico le dar una Gu[place] del medicamento especial (MedGuide, nombre en ingls) con cada receta y en cada ocasin que la vuelva a surtir. Asegrese de leer esta informacin cada vez cuidadosamente. Hable con su pediatra para informarse acerca del uso de este medicamento en nios. Puede requerir atencin especial. Sobredosis: Pngase en contacto inmediatamente con un centro toxicolgico o una sala de urgencia si usted cree que haya tomado demasiado medicamento. ATENCIN: [employer] es solo para usted. No comparta este medicamento con nadie. Qu sucede si me olvido de una dosis? No se aplica en este caso. Este medicamento no es para uso regular. Qu puede interactuar con este medicamento? alcohol antihistamnicos para [place], tos y resfro atropina ciertos medicamentos para la ansiedad o para dormir ciertos medicamentos para problemas de vejiga, tales como oxibutinina y tolterodina ciertos medicamentos para la depresin, tales como amitriptilina, fluoxetina, sertralina ciertos medicamentos para Training and development officer, tales como diciclomina e hiosciamina ciertos medicamentos para el mal de Parkinson, tales como benzatropina y trihexifenidilo ciertos medicamentos para convulsiones, tales como fenobarbital, primidona anestsicos generales, tales como halotano, isoflurano, metoxiflurano, propofol ipratropio anestsicos locales, tales como lidocana, pramoxina, tetracana medicamentos para relajar los msculos antes de una ciruga fenotiazinas, tales como clorpromazina, mesoridazina, Government social research officer, tioridazina medicamentos narcticos para Conservation officer, historic buildings otros alcaloides de belladona Puede ser que esta lista no menciona todas las posibles interacciones. Informe a su profesional de [employer] Home	[place] de [employer] productos a base de hierbas, medicamentos  de [place] o suplementos nutritivos que est tomando. Si usted fuma, consume bebidas alcohlicas o si utiliza drogas ilegales, indqueselo tambin a su profesional de [employer] Home	[place]. Algunas sustancias pueden interactuar con su medicamento. A qu debo estar atento al usar Coca[employer]Cola? Limite el contacto con el agua mientras nada y se baa porque el parche puede desprenderse. Si se desprende el parche, trelo y colquese uno nuevo detrs de la [place]. Puede experimentar somnolencia o mareos. No conduzca, no utilice maquinaria ni haga nada que Associate Professor en estado de alerta hasta que sepa cmo le afecta este medicamento. No se siente ni se ponga de pie con rapidez, especialmente si es un paciente de edad avanzada. Esto reduce el riesgo de mareos o [employer]. El alcohol puede interferir con el efecto de este medicamento. Evite consumir bebidas alcohlicas. Se le podra secar la boca. Masticar chicle sin azcar, chupar caramelos duros y tomar agua en abundancia le ayudar a mantener la boca hmeda. Si el problema no desaparece o es severo, consulte a su profesional de KB Home	[place]. Este medicamento puede resecarle los ojos y provocar visin borrosa. Si [place] lentes de contacto, puede sentir ciertas molestias. Las gotas lubricantes pueden ser tiles. Si el problema no desaparece o es severo, consulte a su profesional de KB Home	[place]. Si va a someterse a una operacin, a una Health visitor, a una tomografa computarizada, o a otro procedimiento, informe a Barrister's clerk de  la salud que est usando este medicamento. Es posible que deba quitarse el parche antes del procedimiento. Qu efectos secundarios puedo tener al [employer] este medicamento? Efectos secundarios que debe informar a su mdico o a Barrister's clerk de la salud tan pronto como sea posible: Chief of Staff, como erupcin cutnea, comezn/picazn o urticaria; hinchazn de la cara, los labios o la lengua visin borrosa cambios en la  visin confusin mareos dolor ocular ritmo cardiaco rpido, irregular alucinaciones, prdida del contacto con la realidad nuseas, vmito dolor o problemas al orinar inquietud convulsiones irritacin de la piel dolor estomacal Efectos secundarios que generalmente no requieren atencin mdica (infrmelos a su mdico o a Barrister's clerk de la salud si persisten o si son molestos): somnolencia boca seca dolor de Special educational needs teacher de garganta Puede ser que esta lista no menciona todos los posibles efectos secundarios. Comunquese a su mdico por asesoramiento mdico Humana Inc. Usted puede informar los efectos secundarios a la FDA por telfono al 1-800-FDA-1088. Dnde debo guardar mi medicina? Mantenga fuera del alcance de los nios. Guarde a [employer], entre 20 y 76 grados Celsius (50 y 62 grados Fahrenheit). Mantenga este medicamento en el envase de aluminio hasta que est listo para usarlo. Deseche todo el medicamento que no haya utilizado despus de la fecha de vencimiento. ATENCIN: Este folleto es un resumen. Puede ser que no cubra toda la posible informacin. Si usted tiene preguntas acerca de esta medicina, consulte con su mdico, su farmacutico o su profesional de Technical sales engineer.  2020 Elsevier/Gold Standard (2017-07-12 00:00:00)   Anestesia general en adultos, cuidados posteriores General Anesthesia, Adult, Care After Lea esta informacin sobre cmo cuidarse despus del procedimiento. El mdico tambin podr darle instrucciones ms especficas. Comunquese con su mdico si tiene problemas o preguntas. Qu puedo esperar despus del procedimiento? Luego del procedimiento, son comunes los siguiente efectos secundarios:  Dolor o Scientist, research (life sciences) en el lugar de la va intravenosa (i.v.).  Nuseas.  Vmitos.  Dolor de Investment banker, operational.  Dificultad para concentrarse.  Sentir fro o [employer].  Debilidad o cansancio.  Somnolencia y Programmer, applications.  Malestar y Hydrologist.  Estos efectos secundarios pueden afectar partes del cuerpo que no estuvieron involucradas en la ciruga. Siga estas indicaciones en su casa:  Durante al menos 24horas despus del procedimiento:  Pdale a un adulto responsable que permanezca con usted. Es importante que alguien cuide de usted hasta que se despierte y Cabin crew.  Descanse todo lo que sea necesario.  No haga lo siguiente: ? Participar en actividades en las que podra caerse o lastimarse. ? Conducir. ? Operar maquinarias pesadas. ? Beber alcohol. ? Tomar somnferos o medicamentos que causen somnolencia. ? Firmar documentos legales ni tomar [employer]. ? Cuidar a nios por su cuenta. Qu debe comer y beber  Siga las indicaciones del mdico respecto de las restricciones de comidas o bebidas.  Cuando [employer], comience a comer cantidades pequeas de alimentos que sean blandos y fciles de Publishing copy (livianos), como una tostada. Retome su dieta habitual de forma gradual.  Beba suficiente lquido como para mantener la orina de color amarillo plido.  Si vomita, rehidrtese tomando agua, jugo o caldo transparente. Instrucciones generales  Si tiene apnea del sueo, la [place] y ciertos medicamentos pueden aumentar el riesgo de problemas respiratorios. Siga las indicaciones del mdico respecto al uso de su dispositivo para dormir: ? Siempre que duerma, incluso durante las siestas que tome en el da. ? Mientras tome analgsicos recetados, medicamentos para dormir o medicamentos que  producen somnolencia.  Reanude sus actividades normales segn lo indicado por el mdico. Pregntele al mdico qu actividades son seguras para usted.  Tome los medicamentos de venta libre y los recetados solamente como se lo haya indicado el mdico.  Si fuma, no lo haga sin supervisin.  Concurra a todas las visitas de seguimiento como se lo haya indicado el mdico. Esto es importante. Comunquese con un mdico si:  Tiene  nuseas o vmitos que no mejoran con medicamentos.  No puede comer ni beber sin vomitar.  El dolor no se alivia con medicamentos.  No puede orinar.  Tiene una erupcin cutnea.  Tiene fiebre.  Presenta enrojecimiento alrededor del lugar de la va intravenosa (i.v.) que empeora. Solicite ayuda de inmediato si:  Tiene dificultad para respirar.  Siente dolor en el pecho.  Observa sangre en la orina o heces, o vomita sangre. Resumen  Despus del procedimiento, es comn tener dolor de garganta y nuseas. Tambin es comn sentirse cansado.  Pdale a un adulto responsable que permanezca con usted durante 24 horas despus de la anestesia general. Es importante que alguien cuide de usted hasta que se despierte y Cabin crew.  Cuando [employer], comience a comer cantidades pequeas de alimentos que sean blandos y fciles de Publishing copy (livianos), como una tostada. Retome su dieta habitual de forma gradual.  Beba suficiente lquido como para mantener la orina de color amarillo plido.  Reanude sus actividades normales segn lo indicado por el mdico. Pregntele al mdico qu actividades son seguras para usted. Esta informacin no tiene Marine scientist el consejo del mdico. Asegrese de hacerle al mdico cualquier pregunta que tenga. Document Released: 03/01/2005 Document Revised: 12/27/2016 Document Reviewed: 12/27/2016 Elsevier Patient Education  2020 [employer].

## 2019-01-12 ENCOUNTER — Encounter: Payer: Self-pay | Admitting: Orthopedic Surgery

## 2019-01-18 DIAGNOSIS — S83242D Other tear of medial meniscus, current injury, left knee, subsequent encounter: Secondary | ICD-10-CM | POA: Diagnosis not present

## 2019-01-22 DIAGNOSIS — S83242D Other tear of medial meniscus, current injury, left knee, subsequent encounter: Secondary | ICD-10-CM | POA: Diagnosis not present

## 2019-01-26 DIAGNOSIS — S83242D Other tear of medial meniscus, current injury, left knee, subsequent encounter: Secondary | ICD-10-CM | POA: Diagnosis not present

## 2019-01-30 DIAGNOSIS — S83242D Other tear of medial meniscus, current injury, left knee, subsequent encounter: Secondary | ICD-10-CM | POA: Diagnosis not present

## 2019-02-02 DIAGNOSIS — S83242D Other tear of medial meniscus, current injury, left knee, subsequent encounter: Secondary | ICD-10-CM | POA: Diagnosis not present

## 2019-02-06 DIAGNOSIS — S83242D Other tear of medial meniscus, current injury, left knee, subsequent encounter: Secondary | ICD-10-CM | POA: Diagnosis not present

## 2019-02-12 ENCOUNTER — Inpatient Hospital Stay
Admission: RE | Admit: 2019-02-12 | Discharge: 2019-02-12 | Disposition: A | Discharge status: Home / Self Care | Payer: Self-pay | Source: Ambulatory Visit | Attending: *Deleted | Admitting: *Deleted

## 2019-02-12 ENCOUNTER — Telehealth: Payer: Self-pay

## 2019-02-12 ENCOUNTER — Other Ambulatory Visit: Payer: Self-pay | Admitting: *Deleted

## 2019-02-12 DIAGNOSIS — Z1231 Encounter for screening mammogram for malignant neoplasm of breast: Secondary | ICD-10-CM

## 2019-02-12 DIAGNOSIS — R928 Other abnormal and inconclusive findings on diagnostic imaging of breast: Secondary | ICD-10-CM

## 2019-02-12 NOTE — Telephone Encounter (Signed)
Ordered diagnostic mammo  Nobie Putnam, DO [facility] 02/12/2019, 10:45 AM

## 2019-02-16 DIAGNOSIS — S83242D Other tear of medial meniscus, current injury, left knee, subsequent encounter: Secondary | ICD-10-CM | POA: Diagnosis not present

## 2019-02-23 ENCOUNTER — Ambulatory Visit
Admission: RE | Admit: 2019-02-23 | Discharge: 2019-02-23 | Disposition: A | Discharge status: Home / Self Care | Payer: BC Managed Care – PPO | Source: Ambulatory Visit | Attending: Family Medicine | Admitting: Family Medicine

## 2019-02-23 ENCOUNTER — Ambulatory Visit
Admission: RE | Admit: 2019-02-23 | Discharge: 2019-02-23 | Disposition: A | Discharge status: Home / Self Care | Payer: BC Managed Care – PPO | Source: Ambulatory Visit | Attending: Nurse Practitioner | Admitting: Nurse Practitioner

## 2019-02-23 DIAGNOSIS — R928 Other abnormal and inconclusive findings on diagnostic imaging of breast: Secondary | ICD-10-CM

## 2019-02-23 DIAGNOSIS — N6311 Unspecified lump in the right breast, upper outer quadrant: Secondary | ICD-10-CM | POA: Diagnosis not present

## 2019-03-05 ENCOUNTER — Telehealth: Payer: Self-pay

## 2019-03-05 NOTE — Telephone Encounter (Signed)
Her mammogram was completed already diagnostic and [place], and next due repeat in 1 year, determined to be probable benign.  Can follow-up as scheduled now for this complaint.  Nobie Putnam, [facility] 03/05/2019, 4:47 PM

## 2019-03-05 NOTE — Telephone Encounter (Signed)
The pt family member called to translate her concerns. The pt is complaining of a burning sensation in the right breast mostly on the top of the breast and around the areola. She is concern because she recently had an abnormal mammogram w/ suspicious finding in the Right breast. I schedule an appointment for the patient on Tuesday, Dec 29th .

## 2019-03-06 DIAGNOSIS — M1712 Unilateral primary osteoarthritis, left knee: Secondary | ICD-10-CM | POA: Diagnosis not present

## 2019-03-06 DIAGNOSIS — M1711 Unilateral primary osteoarthritis, right knee: Secondary | ICD-10-CM | POA: Diagnosis not present

## 2019-03-13 ENCOUNTER — Ambulatory Visit: Payer: BC Managed Care – PPO | Admitting: Family Medicine

## 2019-04-04 ENCOUNTER — Other Ambulatory Visit: Payer: Self-pay | Admitting: Nurse Practitioner

## 2019-04-04 DIAGNOSIS — I10 Essential (primary) hypertension: Secondary | ICD-10-CM

## 2019-04-12 ENCOUNTER — Ambulatory Visit: Payer: BC Managed Care – PPO | Admitting: Nurse Practitioner

## 2019-04-27 ENCOUNTER — Encounter: Payer: BLUE CROSS/BLUE SHIELD | Admitting: Nurse Practitioner

## 2019-05-10 ENCOUNTER — Encounter: Payer: Self-pay | Admitting: Family Medicine

## 2019-05-10 ENCOUNTER — Ambulatory Visit (INDEPENDENT_AMBULATORY_CARE_PROVIDER_SITE_OTHER): Payer: BLUE CROSS/BLUE SHIELD | Admitting: Family Medicine

## 2019-05-10 ENCOUNTER — Other Ambulatory Visit: Payer: Self-pay

## 2019-05-10 VITALS — BP 134/69 | HR 71 | Temp 97.5°F | Ht 60.0 in | Wt 216.2 lb

## 2019-05-10 DIAGNOSIS — I10 Essential (primary) hypertension: Secondary | ICD-10-CM

## 2019-05-10 DIAGNOSIS — G44209 Tension-type headache, unspecified, not intractable: Secondary | ICD-10-CM | POA: Diagnosis not present

## 2019-05-10 DIAGNOSIS — F419 Anxiety disorder, unspecified: Secondary | ICD-10-CM | POA: Diagnosis not present

## 2019-05-10 DIAGNOSIS — R519 Headache, unspecified: Secondary | ICD-10-CM | POA: Insufficient documentation

## 2019-05-10 DIAGNOSIS — K219 Gastro-esophageal reflux disease without esophagitis: Secondary | ICD-10-CM

## 2019-05-10 MED ORDER — PROMETHAZINE HCL 25 MG/ML IJ SOLN
25.0000 mg | Freq: Once | INTRAMUSCULAR | Status: AC
  Administered 2019-05-10: 25 mg via INTRAMUSCULAR

## 2019-05-10 MED ORDER — KETOROLAC TROMETHAMINE 60 MG/2ML IM SOLN
60.0000 mg | Freq: Once | INTRAMUSCULAR | Status: AC
  Administered 2019-05-10: 60 mg via INTRAMUSCULAR

## 2019-05-10 NOTE — Assessment & Plan Note (Signed)
Has a long standing history of GERD and in the past was prescribed a medication many years ago.  Patient unaware what she was prescribed in the past.  Symptoms are intermittent and worsened with her anxiety.  Discussed how increased anxiety can worsen symptoms of GERD.  To work on lessening anxiety and symptoms should improve.

## 2019-05-10 NOTE — Progress Notes (Signed)
I have reviewed this encounter including the documentation in this note and/or discussed this patient with the provider, Nicole Malfi FNP. I am certifying that I agree with the content of this note as supervising physician.  Paulette Rockford, DO [facility] 05/10/2019, 4:05 PM  

## 2019-05-10 NOTE — Assessment & Plan Note (Signed)
Current tension headache that started 4 days ago, has waxed and waned without any complete relief.  Plan: 1) Given Toradol 60mg  IM and Phenergan 25mg  IM today in clinic to help break the headache 2) To return home and rest for the rest of the day, avoid screens (phone, television, computer) for the rest of the day 3) Can use a heating pad on the upper back (trapezius area) to help with relaxation of the muscles 4) Referral placed to Neurology for report of having a "weak brain" and being more "prone to stroke" without any records available for review or before starting any headache prophylaxis 5) Call with any worsening of symptoms 6) If you begin to have visual changes, dizziness, the "worst headache of your life" to PROCEED TO THE EMERGENCY ROOM IMMEDIATELY!

## 2019-05-10 NOTE — Assessment & Plan Note (Signed)
Current situational anxiety x 1 year.  Is in the process of separation with her spouse and has kept it away from her family up until recently.  Feels has a good support system at home.  Offered and declined referral to therapy and/or social work.  Discussed medication options for anxiety/depression and patient requesting to hold off on that at this time.  Plan: 1) Follow up in 2 weeks via phone to check in on symptoms 2) To engage in outdoor activities with children, such as the park or walking around outside, with good social distancing and mask wearing 3) To call with any worsening of symptoms, any questions/concerns/needs

## 2019-05-10 NOTE — Progress Notes (Signed)
Subjective:    Patient ID: Brittany Pratt, female    DOB: March 25, 1975, 44 y.o.   MRN: [id number]  Brittany Pratt is a 44 y.o. female presenting on 05/10/2019 for Hypertension, Headache (Constant throbbing and tight sensation  pain in the back of the head that radiates up .x 4 days ), and Anxiety (situational anxiety, pt currently not employed and in having marriage issues.)   HPI  Brittany Pratt presents to clinic with interpretation services for Spanish translation.  States she has been taking her Valsartan and her blood pressures have been running under 140/90 while at home.  Tolerating medications well.  Denies dizziness, lightheadedness, visual changes, chest pain, SOB.  Currently she has had a headache for the past 4 days that started at the back of her head and has increased around to the top of her head.  Started gradually, has had some symptom relief with using cool water to her head and resting in a dark, quiet room.  Has waxed and waned without any full symptom resolution.  Hasn't taken anything over the counter for her symptoms.  Has been having increased anxiety over the past year related to being separated from her spouse.  Has not spoken with anyone in her family about what is going on up until recently.  States speaking with family has helped but she still finds herself less interested in participating in going to the park with her children and more interested in eating and avoiding people.  Denies any SI/HI.  States she was on an antidepressant 19-20 years ago but doesn't remember what she had taken and reports no improvement with the medication so she had stopped it.  Is not open to medication or therapy referral at this time.  Depression screen [facility] 2/9 05/10/2019 04/25/2018 04/11/2018  Decreased Interest 1 0 1  Down, Depressed, Hopeless 0 0 0  PHQ - 2 Score 1 0 1  Altered sleeping - - 1  Tired, decreased energy - - 3  Change in appetite - - 0  Feeling bad or  failure about yourself  - - 0  Trouble concentrating - - 1  Moving slowly or fidgety/restless - - 0  Suicidal thoughts - - 0  PHQ-9 Score - - 6  Difficult doing work/chores - - Somewhat difficult    Social History   Tobacco Use  . Smoking status: Never Smoker  . Smokeless tobacco: Never Used  Substance Use Topics  . Alcohol use: No  . Drug use: No    Review of Systems  Constitutional: Negative for fatigue and fever.  HENT: Negative for rhinorrhea, sinus pressure and sinus pain.   Eyes: Negative for pain and visual disturbance.  Respiratory: Negative for cough, choking, chest tightness, shortness of breath and wheezing.   Cardiovascular: Negative for chest pain, palpitations and leg swelling.  Gastrointestinal: Negative for abdominal pain, constipation, diarrhea, nausea and vomiting.  Endocrine: Negative for polydipsia, polyphagia and polyuria.  Genitourinary: Negative for difficulty urinating, dysuria, frequency, hematuria and urgency.  Musculoskeletal: Negative for arthralgias, back pain, gait problem, joint swelling and myalgias.  Neurological: Positive for headaches. Negative for dizziness, syncope, speech difficulty, weakness, light-headedness and numbness.  Hematological: Negative for adenopathy. Does not bruise/bleed easily.  Psychiatric/Behavioral: Positive for dysphoric mood. Negative for agitation, behavioral problems, confusion, decreased concentration, hallucinations, self-injury, sleep disturbance and suicidal ideas. The patient is nervous/anxious. The patient is not hyperactive.    Per HPI unless specifically indicated above     Objective:  BP 134/69 (BP Location: Left Arm, Patient Position: Sitting, Cuff Size: Large)   Pulse 71   Temp (!) 97.5 F (36.4 C) (Oral)   Ht 5' (1.524 m)   Wt 216 lb 3.2 oz (98.1 kg)   BMI 42.22 kg/m   Wt Readings from Last 3 Encounters:  05/10/19 216 lb 3.2 oz (98.1 kg)  01/11/19 210 lb 1.6 oz (95.3 kg)  10/09/18 206 lb 9.6 oz  (93.7 kg)    Physical Exam Vitals reviewed.  Constitutional:      Appearance: She is well-groomed. She is obese. She is not ill-appearing or toxic-appearing.  HENT:     Head: Normocephalic.  Eyes:     General: Lids are normal. Vision grossly intact. No visual field deficit or scleral icterus.    Extraocular Movements: Extraocular movements intact.     Right eye: Normal extraocular motion and no nystagmus.     Left eye: Normal extraocular motion and no nystagmus.     Pupils: Pupils are equal, round, and reactive to light. Pupils are equal.     Right eye: Pupil is round and reactive.     Left eye: Pupil is round and reactive.  Neck:     Thyroid: No thyroid mass or thyromegaly.  Cardiovascular:     Rate and Rhythm: Normal rate and regular rhythm.     Pulses:          Dorsalis pedis pulses are 2+ on the right side and 2+ on the left side.       Posterior tibial pulses are 2+ on the right side and 2+ on the left side.     Heart sounds: Normal heart sounds. No murmur. No friction rub. No gallop.   Pulmonary:     Effort: Pulmonary effort is normal. No respiratory distress.     Breath sounds: Normal breath sounds.  Abdominal:     General: Abdomen is flat. Bowel sounds are normal. There is no distension.     Palpations: Abdomen is soft. There is no hepatomegaly, splenomegaly or mass.     Tenderness: There is no abdominal tenderness.  Musculoskeletal:        General: Tenderness present.     Cervical back: Normal range of motion and neck supple. No rigidity.     Right lower leg: No edema.     Left lower leg: No edema.     Comments: Right & Left Trapezius tightening and tenderness with palpation  Feet:     Right foot:     Skin integrity: Skin integrity normal.     Left foot:     Skin integrity: Skin integrity normal.  Lymphadenopathy:     Cervical: No cervical adenopathy.  Skin:    General: Skin is warm and dry.     Capillary Refill: Capillary refill takes less than 2 seconds.    Neurological:     Mental Status: She is alert and oriented to person, place, and time.     GCS: GCS eye subscore is 4. GCS verbal subscore is 5. GCS motor subscore is 6.     Cranial Nerves: Cranial nerves are intact. No cranial nerve deficit, dysarthria or facial asymmetry.     Sensory: Sensation is intact. No sensory deficit.     Motor: Motor function is intact. No weakness.     Coordination: Coordination is intact. Coordination normal.     Gait: Gait is intact. Gait normal.  Psychiatric:        Attention and Perception: Attention  and perception normal.        Mood and Affect: Mood is anxious and depressed. Affect is tearful.        Speech: Speech normal.        Behavior: Behavior normal. Behavior is not agitated. Behavior is cooperative.        Thought Content: Thought content normal.        Cognition and Memory: Cognition and memory normal. Cognition is not impaired. Memory is not impaired.        Judgment: Judgment normal.    Results for orders placed or performed during the hospital encounter of 01/08/19  SARS CORONAVIRUS 2 (TAT 6-24 HRS) Nasopharyngeal Nasopharyngeal Swab   Specimen: Nasopharyngeal Swab  Result Value Ref Range   SARS Coronavirus 2 NEGATIVE NEGATIVE      Assessment & Plan:   Problem List Items Addressed This Visit      Cardiovascular and Mediastinum   Essential hypertension - Primary    Stable and well controlled on current medication regimen.  Currently taking Valsartan 40mg  daily and tolerating it well.   Plan: 1) Labs ordered today and to be drawn within the next 2 weeks 2) Continue Valsartan 40mg  daily 3) Take blood pressure readings regularly and write in a log.  Bring that log to your next appointment. 4) Heart healthy diet and to exercise every other day for 30 minutes per day, going no more than 2 days in a row without exercise. 5) We will see you back in 6 months      Relevant Orders   CBC with Differential   Comprehensive Metabolic Panel  (CMET)     Digestive   GERD (gastroesophageal reflux disease)    Has a long standing history of GERD and in the past was prescribed a medication many years ago.  Patient unaware what she was prescribed in the past.  Symptoms are intermittent and worsened with her anxiety.  Discussed how increased anxiety can worsen symptoms of GERD.  To work on lessening anxiety and symptoms should improve.        Other   Obesity, Class III, BMI 40-49.9 (morbid obesity) (HCC)   Relevant Orders   CBC with Differential   Comprehensive Metabolic Panel (CMET)   Lipid Profile   HgB A1c   Thyroid Panel With TSH   Headache    Current tension headache that started 4 days ago, has waxed and waned without any complete relief.  Plan: 1) Given Toradol 60mg  IM and Phenergan 25mg  IM today in clinic to help break the headache 2) To return home and rest for the rest of the day, avoid screens (phone, television, computer) for the rest of the day 3) Can use a heating pad on the upper back (trapezius area) to help with relaxation of the muscles 4) Referral placed to Neurology for report of having a "weak brain" and being more "prone to stroke" without any records available for review or before starting any headache prophylaxis 5) Call with any worsening of symptoms 6) If you begin to have visual changes, dizziness, the "worst headache of your life" to PROCEED TO THE EMERGENCY ROOM IMMEDIATELY!      Relevant Medications   traMADol (ULTRAM) 50 MG tablet   meloxicam (MOBIC) 15 MG tablet   Other Relevant Orders   Ambulatory referral to Neurology   Anxiety    Current situational anxiety x 1 year.  Is in the process of separation with her spouse and has kept it away from her  family up until recently.  Feels has a good support system at home.  Offered and declined referral to therapy and/or social work.  Discussed medication options for anxiety/depression and patient requesting to hold off on that at this time.  Plan: 1)  Follow up in 2 weeks via phone to check in on symptoms 2) To engage in outdoor activities with children, such as the park or walking around outside, with good social distancing and mask wearing 3) To call with any worsening of symptoms, any questions/concerns/needs         Meds ordered this encounter  Medications  . ketorolac (TORADOL) injection 60 mg  . promethazine (PHENERGAN) injection 25 mg      Follow up plan: Return in about 6 months (around 11/07/2019) for HTN, Anxiety, PAP. Will call in 2 weeks to see how she is doing with her anxiety. To call clinic if any worsening of symptoms.  Charlaine Dalton, FNP Family Nurse Practitioner [facility] 05/10/2019, 8:47 AM

## 2019-05-10 NOTE — Patient Instructions (Addendum)
Will send updated in Spanish once receive back from interpretation department.  The following recommendations are helpful adjuncts for helping rebalance your mood.  Eat a nourishing diet. Ensure adequate intake of calories, protein, carbs, fat, vitamins, and minerals. Prioritize whole foods at each meal, including meats, vegetables, fruits, nuts and seeds, etc.   Avoid inflammatory and/or "junk" foods, such as sugar, omega-6 fats, refined grains, chemicals, and preservatives are common in packaged and prepared foods. Minimize or completely avoid these ingredients and stick to whole foods with little to no additives. Cook from scratch as much as possible for more control over what you eat  Get enough sleep. Poor sleep is significantly associated with depression and anxiety. Make 7-9 hours of sleep nightly a top priority  Exercise appropriately. Exercise is known to improve brain functioning and boost mood. Aim for 30 minutes of daily physical activity. Avoid "overtraining," which can cause mental disturbances  Assess your light exposure. Not enough natural light during the day and too much artificial light can have a major impact on your mood. Get outside as often as possible during daylight hours. Minimize light exposure after dark and avoid the use of electronics that give off blue light before bed  Manage your stress.  Use daily stress management techniques such as meditation, yoga, or mindfulness to retrain your brain to respond differently to stress. Try deep breathing to deactivate your "fight or flight" response.  There are many of sources with apps like Headspace, Calm or a variety of YouTube videos (videos from [employer] have guided meditation)  Prioritize your social life. Work on building social support with new friends or improve current relationships. Consider getting a pet that allows for companionship, social interaction, and physical touch. Try volunteering or joining a  faith-based community to increase your sense of purpose  Take time to play Unstructured "play" time can help reduce anxiety and depression Options for play include music, games, sports, dance, art, etc.  Try to add daily omega 3 fatty acids, magnesium, B complex, and balanced amino acid supplements to help improve mood and anxiety.  You will receive a survey after today's visit either digitally by e-mail or paper by [employer]. Your experiences and feedback matter to [place].  Please respond so we know how we are doing as we provide care for you.  Call us with any questions/concerns/needs.  It is my goal to be available to you for your health concerns.  Thanks for choosing me to be a partner in your healthcare needs!  Charlaine Dalton, FNP-C Family Nurse Practitioner [facility] Phone: [phone]

## 2019-05-10 NOTE — Assessment & Plan Note (Signed)
Stable and well controlled on current medication regimen.  Currently taking Valsartan 40mg  daily and tolerating it well.   Plan: 1) Labs ordered today and to be drawn within the next 2 weeks 2) Continue Valsartan 40mg  daily 3) Take blood pressure readings regularly and write in a log.  Bring that log to your next appointment. 4) Heart healthy diet and to exercise every other day for 30 minutes per day, going no more than 2 days in a row without exercise. 5) We will see you back in 6 months

## 2019-05-22 DIAGNOSIS — G44209 Tension-type headache, unspecified, not intractable: Secondary | ICD-10-CM | POA: Diagnosis not present

## 2019-05-22 DIAGNOSIS — M542 Cervicalgia: Secondary | ICD-10-CM | POA: Diagnosis not present

## 2019-05-22 DIAGNOSIS — E538 Deficiency of other specified B group vitamins: Secondary | ICD-10-CM | POA: Diagnosis not present

## 2019-05-22 DIAGNOSIS — E559 Vitamin D deficiency, unspecified: Secondary | ICD-10-CM | POA: Diagnosis not present

## 2019-05-22 DIAGNOSIS — M5481 Occipital neuralgia: Secondary | ICD-10-CM | POA: Diagnosis not present

## 2019-05-23 ENCOUNTER — Other Ambulatory Visit: Payer: Self-pay | Admitting: Family Medicine

## 2019-05-23 DIAGNOSIS — I10 Essential (primary) hypertension: Secondary | ICD-10-CM | POA: Diagnosis not present

## 2019-05-24 LAB — COMPREHENSIVE METABOLIC PANEL
ALT: 26 IU/L (ref 0–32)
AST: 24 IU/L (ref 0–40)
Albumin/Globulin Ratio: 1.8 (ref 1.2–2.2)
Albumin: 4.2 g/dL (ref 3.8–4.8)
Alkaline Phosphatase: 100 IU/L (ref 39–117)
BUN/Creatinine Ratio: 18 (ref 9–23)
BUN: 11 mg/dL (ref 6–24)
Bilirubin Total: 0.4 mg/dL (ref 0.0–1.2)
CO2: 22 mmol/L (ref 20–29)
Calcium: 9.1 mg/dL (ref 8.7–10.2)
Chloride: 99 mmol/L (ref 96–106)
Creatinine, Ser: 0.62 mg/dL (ref 0.57–1.00)
GFR calc Af Amer: 130 mL/min/{1.73_m2} (ref >59)
GFR calc non Af Amer: 112 mL/min/{1.73_m2} (ref >59)
Globulin, Total: 2.4 g/dL (ref 1.5–4.5)
Glucose: 147 mg/dL — ABNORMAL HIGH (ref 65–99)
Potassium: 4.2 mmol/L (ref 3.5–5.2)
Sodium: 137 mmol/L (ref 134–144)
Total Protein: 6.6 g/dL (ref 6.0–8.5)

## 2019-05-24 LAB — THYROID PANEL WITH TSH
Free Thyroxine Index: 2.1 (ref 1.2–4.9)
T3 Uptake Ratio: 21 % — ABNORMAL LOW (ref 24–39)
T4, Total: 9.8 ug/dL (ref 4.5–12.0)
TSH: 3.4 u[IU]/mL (ref 0.450–4.500)

## 2019-05-24 LAB — CBC WITH DIFFERENTIAL/PLATELET
Basophils Absolute: 0.1 10*3/uL (ref 0.0–0.2)
Basos: 1 %
EOS (ABSOLUTE): 0.1 10*3/uL (ref 0.0–0.4)
Eos: 2 %
Hematocrit: 38 % (ref 34.0–46.6)
Hemoglobin: 12.8 g/dL (ref 11.1–15.9)
Immature Grans (Abs): 0 10*3/uL (ref 0.0–0.1)
Immature Granulocytes: 1 %
Lymphocytes Absolute: 1.9 10*3/uL (ref 0.7–3.1)
Lymphs: 23 %
MCH: 28.1 pg (ref 26.6–33.0)
MCHC: 33.7 g/dL (ref 31.5–35.7)
MCV: 83 fL (ref 79–97)
Monocytes Absolute: 0.5 10*3/uL (ref 0.1–0.9)
Monocytes: 6 %
Neutrophils Absolute: 5.7 10*3/uL (ref 1.4–7.0)
Neutrophils: 67 %
Platelets: 262 10*3/uL (ref 150–450)
RBC: 4.56 x10E6/uL (ref 3.77–5.28)
RDW: 13 % (ref 11.7–15.4)
WBC: 8.3 10*3/uL (ref 3.4–10.8)

## 2019-05-24 LAB — HGB A1C W/O EAG: Hgb A1c MFr Bld: 5.9 % — ABNORMAL HIGH (ref 4.8–5.6)

## 2019-05-24 LAB — LIPID PANEL W/O CHOL/HDL RATIO
Cholesterol, Total: 203 mg/dL — ABNORMAL HIGH (ref 100–199)
HDL: 56 mg/dL (ref >39)
LDL Chol Calc (NIH): 127 mg/dL — ABNORMAL HIGH (ref 0–99)
Triglycerides: 115 mg/dL (ref 0–149)
VLDL Cholesterol Cal: 20 mg/dL (ref 5–40)

## 2019-05-30 ENCOUNTER — Other Ambulatory Visit: Payer: Self-pay

## 2019-05-30 DIAGNOSIS — I10 Essential (primary) hypertension: Secondary | ICD-10-CM

## 2019-05-30 MED ORDER — VALSARTAN 40 MG PO TABS: 40.0000 mg | ORAL_TABLET | Freq: Every day | ORAL | 3 refills | Status: DC

## 2019-05-30 NOTE — Progress Notes (Signed)
Labs continue to show prediabetes, a bit better than they were 1 year ago.  Cholesterol is slightly elevated but is not requiring any medication just increased diet and exercise.  Thyroid levels are within normal limits.  Plan to see her back in 6 months as discussed.  Thanks

## 2019-06-11 DIAGNOSIS — M5481 Occipital neuralgia: Secondary | ICD-10-CM | POA: Diagnosis not present

## 2019-06-11 DIAGNOSIS — E538 Deficiency of other specified B group vitamins: Secondary | ICD-10-CM | POA: Diagnosis not present

## 2019-06-11 DIAGNOSIS — G44209 Tension-type headache, unspecified, not intractable: Secondary | ICD-10-CM | POA: Diagnosis not present

## 2019-06-11 DIAGNOSIS — E559 Vitamin D deficiency, unspecified: Secondary | ICD-10-CM | POA: Diagnosis not present

## 2019-06-11 DIAGNOSIS — E611 Iron deficiency: Secondary | ICD-10-CM | POA: Diagnosis not present

## 2019-07-10 DIAGNOSIS — M170 Bilateral primary osteoarthritis of knee: Secondary | ICD-10-CM | POA: Diagnosis not present

## 2019-07-11 DIAGNOSIS — R519 Headache, unspecified: Secondary | ICD-10-CM | POA: Diagnosis not present

## 2019-07-11 DIAGNOSIS — G44209 Tension-type headache, unspecified, not intractable: Secondary | ICD-10-CM | POA: Diagnosis not present

## 2019-07-20 DIAGNOSIS — Z23 Encounter for immunization: Secondary | ICD-10-CM | POA: Diagnosis not present

## 2019-07-31 DIAGNOSIS — M1711 Unilateral primary osteoarthritis, right knee: Secondary | ICD-10-CM | POA: Diagnosis not present

## 2019-08-07 DIAGNOSIS — M1711 Unilateral primary osteoarthritis, right knee: Secondary | ICD-10-CM | POA: Diagnosis not present

## 2019-08-14 DIAGNOSIS — M1711 Unilateral primary osteoarthritis, right knee: Secondary | ICD-10-CM | POA: Diagnosis not present

## 2019-08-17 DIAGNOSIS — Z23 Encounter for immunization: Secondary | ICD-10-CM | POA: Diagnosis not present

## 2019-10-01 DIAGNOSIS — M170 Bilateral primary osteoarthritis of knee: Secondary | ICD-10-CM | POA: Diagnosis not present

## 2019-10-26 ENCOUNTER — Other Ambulatory Visit: Payer: Self-pay

## 2019-10-26 ENCOUNTER — Ambulatory Visit: Payer: BLUE CROSS/BLUE SHIELD | Admitting: Family Medicine

## 2019-10-26 ENCOUNTER — Encounter: Payer: Self-pay | Admitting: Family Medicine

## 2019-10-26 VITALS — BP 138/72 | HR 76 | Temp 97.5°F | Ht 60.0 in | Wt 208.0 lb

## 2019-10-26 DIAGNOSIS — R8290 Unspecified abnormal findings in urine: Secondary | ICD-10-CM | POA: Insufficient documentation

## 2019-10-26 DIAGNOSIS — F419 Anxiety disorder, unspecified: Secondary | ICD-10-CM

## 2019-10-26 DIAGNOSIS — N924 Excessive bleeding in the premenopausal period: Secondary | ICD-10-CM | POA: Diagnosis not present

## 2019-10-26 DIAGNOSIS — R7309 Other abnormal glucose: Secondary | ICD-10-CM | POA: Diagnosis not present

## 2019-10-26 DIAGNOSIS — I10 Essential (primary) hypertension: Secondary | ICD-10-CM

## 2019-10-26 DIAGNOSIS — G44209 Tension-type headache, unspecified, not intractable: Secondary | ICD-10-CM | POA: Diagnosis not present

## 2019-10-26 DIAGNOSIS — K219 Gastro-esophageal reflux disease without esophagitis: Secondary | ICD-10-CM

## 2019-10-26 DIAGNOSIS — R809 Proteinuria, unspecified: Secondary | ICD-10-CM | POA: Diagnosis not present

## 2019-10-26 LAB — POCT URINALYSIS DIPSTICK
Bilirubin, UA: NEGATIVE
Glucose, UA: NEGATIVE
Leukocytes, UA: NEGATIVE
Nitrite, UA: NEGATIVE
Protein, UA: POSITIVE — AB
Spec Grav, UA: 1.025 (ref 1.010–1.025)
Urobilinogen, UA: 4 E.U./dL — AB
pH, UA: 5 (ref 5.0–8.0)

## 2019-10-26 LAB — POCT URINE PREGNANCY: Preg Test, Ur: NEGATIVE

## 2019-10-26 MED ORDER — IBUPROFEN 600 MG PO TABS: 600.0000 mg | ORAL_TABLET | Freq: Three times a day (TID) | ORAL | 0 refills | Status: AC | PRN

## 2019-10-26 NOTE — Assessment & Plan Note (Signed)
POCT U/A showing proteinuria.  Urine sent to lab for additional U/A with reflex microscopy.  Plan: 1. Urine sent to lab for evaluation

## 2019-10-26 NOTE — Assessment & Plan Note (Signed)
Reports stable and well controlled without any concerns.  Has mood handout from last visit at home.  Plan: 1. Follow up PRN

## 2019-10-26 NOTE — Patient Instructions (Signed)
We have sent your blood work out to the lab and should have results back today/tomorrow.  If there is anything requiring follow up before Monday, we will contact you and let you know.  I have sent in a prescription for ibuprofen 600mg  to take 1 tablet every 8 hours as needed for headache.  This medication is an antiprostaglandin and can help with decreasing vaginal bleeding associated with menses.  Once we have received your lab results back, we will be in touch to update your treatment plan.  We will plan to see you back in 1 month for your physical and PAP testing  You will receive a survey after today's visit either digitally by e-mail or paper by [employer] mail. Your experiences and feedback matter to .  Please respond so we know how we are doing as we provide care for you.  Call us with any questions/concerns/needs.  It is my goal to be available to you for your health concerns.  Thanks for choosing me to be a partner in your healthcare needs!  [place], FNP-C Family Nurse Practitioner [facility] Phone: [phone]

## 2019-10-26 NOTE — Assessment & Plan Note (Signed)
See menorrhagia A/P

## 2019-10-26 NOTE — Assessment & Plan Note (Signed)
Controlled hypertension.  Pt is working on lifestyle modifications.  Taking medications tolerating well without side effects.  Complications: Morbid obesity, GERD  Plan: 1. Continue taking valsartan 40mg  daily 2. Obtain labs today  3. Encouraged heart healthy diet and increasing exercise to 30 minutes most days of the week, going no more than 2 days in a row without exercise. 4. Check BP 1-2 x per week at home, keep log, and bring to clinic at next appointment. 5. Follow up 3 months.

## 2019-10-26 NOTE — Assessment & Plan Note (Signed)
Reports stable and well controlled without medications since anxiety has been better controlled.  No acute concerns.

## 2019-10-26 NOTE — Progress Notes (Signed)
Subjective:    Patient ID: Brittany Pratt, female    DOB: 09-16-1975, 44 y.o.   MRN: [id number]  Brittany Pratt is a 44 y.o. female presenting on 10/26/2019 for Menorrhagia (with blood clots, fatigue, dizziness x 10/06/19 )   HPI  Patient presents to clinic for concerns of menorrhagia that has been present since 10/06/2019.  Reports having blood clots, some fatigue and dizziness that is intermittent.  Does not have a history of anemia or irregular menses.  Has history of BTL for contraception.  Reports headache that happens with her menses, has been alleviated with taking acetaminophen.  Is interested in a prescription for ibuprofen for this.  Does not have an OB/GYN that she meets with for a yearly well woman exam.  Depression screen [facility] 2/9 05/10/2019 04/25/2018 04/11/2018  Decreased Interest 1 0 1  Down, Depressed, Hopeless 0 0 0  PHQ - 2 Score 1 0 1  Altered sleeping - - 1  Tired, decreased energy - - 3  Change in appetite - - 0  Feeling bad or failure about yourself  - - 0  Trouble concentrating - - 1  Moving slowly or fidgety/restless - - 0  Suicidal thoughts - - 0  PHQ-9 Score - - 6  Difficult doing work/chores - - Somewhat difficult    Social History   Tobacco Use  . Smoking status: Never Smoker  . Smokeless tobacco: Never Used  Vaping Use  . Vaping Use: Never used  Substance Use Topics  . Alcohol use: No  . Drug use: No    Review of Systems  Constitutional: Positive for fatigue. Negative for activity change, appetite change, chills, diaphoresis, fever and unexpected weight change.  HENT: Negative.   Eyes: Negative.   Respiratory: Negative.   Cardiovascular: Negative.   Gastrointestinal: Negative.   Endocrine: Negative.   Genitourinary: Positive for vaginal bleeding. Negative for decreased urine volume, difficulty urinating, dyspareunia, dysuria, enuresis, flank pain, frequency, genital sores, hematuria, menstrual problem, pelvic pain,  urgency, vaginal discharge and vaginal pain.  Musculoskeletal: Negative.   Skin: Negative.   Allergic/Immunologic: Negative.   Neurological: Positive for dizziness and headaches. Negative for tremors, seizures, syncope, facial asymmetry, speech difficulty, weakness, light-headedness and numbness.  Hematological: Negative.   Psychiatric/Behavioral: Negative.    Per HPI unless specifically indicated above     Objective:    BP 138/72 (BP Location: Left Arm, Patient Position: Sitting, Cuff Size: Large)   Pulse 76   Temp (!) 97.5 F (36.4 C) (Oral)   Ht 5' (1.524 m)   Wt 208 lb (94.3 kg)   BMI 40.62 kg/m   Wt Readings from Last 3 Encounters:  10/26/19 208 lb (94.3 kg)  05/10/19 216 lb 3.2 oz (98.1 kg)  01/11/19 210 lb 1.6 oz (95.3 kg)    Physical Exam Vitals reviewed.  Constitutional:      General: She is not in acute distress.    Appearance: Normal appearance. She is well-developed and well-groomed. She is morbidly obese. She is not ill-appearing or toxic-appearing.  HENT:     Head: Normocephalic and atraumatic.     Nose:     Comments: Lesia Sago is in place, covering mouth and nose. Eyes:     General: Lids are normal. Vision grossly intact.        Right eye: No discharge.        Left eye: No discharge.     Extraocular Movements: Extraocular movements intact.     Conjunctiva/sclera:  Conjunctivae normal.     Pupils: Pupils are equal, round, and reactive to light.  Cardiovascular:     Rate and Rhythm: Normal rate and regular rhythm.     Pulses: Normal pulses.     Heart sounds: Normal heart sounds. No murmur heard.  No friction rub. No gallop.   Pulmonary:     Effort: Pulmonary effort is normal. No respiratory distress.     Breath sounds: Normal breath sounds.  Abdominal:     Palpations: Abdomen is soft.     Tenderness: There is no abdominal tenderness.  Musculoskeletal:     Right lower leg: No edema.     Left lower leg: No edema.  Skin:    General: Skin is warm and  dry.     Capillary Refill: Capillary refill takes less than 2 seconds.  Neurological:     General: No focal deficit present.     Mental Status: She is alert and oriented to person, place, and time.     Cranial Nerves: No cranial nerve deficit.     Sensory: No sensory deficit.     Motor: No weakness.     Coordination: Coordination normal.     Gait: Gait normal.     Deep Tendon Reflexes: Reflexes normal.  Psychiatric:        Attention and Perception: Attention and perception normal.        Mood and Affect: Mood and affect normal.        Speech: Speech normal.        Behavior: Behavior normal. Behavior is cooperative.        Thought Content: Thought content normal.        Cognition and Memory: Cognition and memory normal.        Judgment: Judgment normal.    Results for orders placed or performed in visit on 10/26/19  POCT Urinalysis Dipstick  Result Value Ref Range   Color, UA Amber    Clarity, UA Clear    Glucose, UA Negative Negative   Bilirubin, UA Negative    Ketones, UA Trace 5    Spec Grav, UA 1.025 1.010 - 1.025   Blood, UA Large 3+    pH, UA 5.0 5.0 - 8.0   Protein, UA Positive (A) Negative   Urobilinogen, UA 4.0 (A) 0.2 or 1.0 E.U./dL   Nitrite, UA Negative    Leukocytes, UA Negative Negative   Appearance     Odor    POCT urine pregnancy  Result Value Ref Range   Preg Test, Ur Negative Negative      Assessment & Plan:   Problem List Items Addressed This Visit      Cardiovascular and Mediastinum   Essential hypertension    Controlled hypertension.  Pt is working on lifestyle modifications.  Taking medications tolerating well without side effects.  Complications: Morbid obesity, GERD  Plan: 1. Continue taking valsartan 40mg  daily 2. Obtain labs today  3. Encouraged heart healthy diet and increasing exercise to 30 minutes most days of the week, going no more than 2 days in a row without exercise. 4. Check BP 1-2 x per week at home, keep log, and bring to  clinic at next appointment. 5. Follow up 3 months.           Digestive   GERD (gastroesophageal reflux disease)    Reports stable and well controlled without medications since anxiety has been better controlled.  No acute concerns.  Other   Headache    Reports headache that has been associated with current menses.  Has been taking acetaminophen as needed with some symptom relief.  Requesting rx for ibuprofen, will send to pharmacy on file.  Plan: 1. Can take ibuprofen 600mg  every 8 hours as needed 2. Increase water intake along with a sports drink, such as gatorade, body armor and/or powerade. 3. If symptoms worsen, begin to have visual changes, worst headache of your life, or comes on suddenly like a thunderclap to proceed to the emergency room immediately.      Relevant Medications   nortriptyline (PAMELOR) 10 MG capsule   ibuprofen (ADVIL) 600 MG tablet   Anxiety    Reports stable and well controlled without any concerns.  Has mood handout from last visit at home.  Plan: 1. Follow up PRN      Relevant Medications   nortriptyline (PAMELOR) 10 MG capsule   Menorrhagia, premenopausal - Primary    Reports longer menses than typical.  Currently LMP started on 10/06/2019 and has continued menses, with clots at times, some fatigue and dizziness with sudden position changes.  Reports history of regular menses in the past without irregularity.  Has history of BTL.  STAT CBC and CMP ordered along with POCT U/A and urine pregnancy test.  POCT U/A with abnormal results, sending for lab U/A with reflex microscopy and urine culture.  Urine pregnancy negative.  Plan: 1. Labs to be drawn and sent out STAT 2. Discussed may need to receive treatment with ER if symptoms worsen over the weekend 3. Based on results of labs and patients symptoms, if stable, may refer to GYN      Relevant Orders   CBC with Differential   COMPLETE METABOLIC PANEL WITH GFR   POCT Urinalysis Dipstick  (Completed)   POCT urine pregnancy (Completed)   Proteinuria    POCT U/A showing proteinuria.  Urine sent to lab for additional U/A with reflex microscopy.  Plan: 1. Urine sent to lab for evaluation      Relevant Orders   Urine Culture   Abnormal urinalysis    See menorrhagia A/P      Relevant Orders   Urinalysis, Routine w reflex microscopic      Meds ordered this encounter  Medications  . ibuprofen (ADVIL) 600 MG tablet    Sig: Take 1 tablet (600 mg total) by mouth every 8 (eight) hours as needed.    Dispense:  30 tablet    Refill:  0      Follow up plan: Return in about 4 weeks (around 11/23/2019) for CPE & PAP.   01/23/2020, FNP Family Nurse Practitioner [facility] 10/26/2019, 2:19 PM

## 2019-10-26 NOTE — Assessment & Plan Note (Signed)
Reports headache that has been associated with current menses.  Has been taking acetaminophen as needed with some symptom relief.  Requesting rx for ibuprofen, will send to pharmacy on file.  Plan: 1. Can take ibuprofen 600mg  every 8 hours as needed 2. Increase water intake along with a sports drink, such as gatorade, body armor and/or powerade. 3. If symptoms worsen, begin to have visual changes, worst headache of your life, or comes on suddenly like a thunderclap to proceed to the emergency room immediately.

## 2019-10-26 NOTE — Assessment & Plan Note (Addendum)
Reports longer menses than typical.  Currently LMP started on 10/06/2019 and has continued menses, with clots at times, some fatigue and dizziness with sudden position changes.  Reports history of regular menses in the past without irregularity.  Has history of BTL.  STAT CBC and CMP ordered along with POCT U/A and urine pregnancy test.  POCT U/A with abnormal results, sending for lab U/A with reflex microscopy and urine culture.  Urine pregnancy negative.  Plan: 1. Labs to be drawn and sent out STAT 2. Discussed may need to receive treatment with ER if symptoms worsen over the weekend 3. Based on results of labs and patients symptoms, if stable, may refer to GYN

## 2019-10-27 LAB — URINALYSIS, ROUTINE W REFLEX MICROSCOPIC
Bilirubin Urine: NEGATIVE
Glucose, UA: NEGATIVE
Hyaline Cast: NONE SEEN /LPF
Nitrite: NEGATIVE
Specific Gravity, Urine: 1.026 (ref 1.001–1.03)
pH: 6 (ref 5.0–8.0)

## 2019-10-27 LAB — URINE CULTURE
MICRO NUMBER:: 10824704
SPECIMEN QUALITY:: ADEQUATE

## 2019-10-30 ENCOUNTER — Ambulatory Visit
Admission: RE | Admit: 2019-10-30 | Discharge: 2019-10-30 | Disposition: A | Discharge status: Home / Self Care | Payer: BLUE CROSS/BLUE SHIELD | Source: Ambulatory Visit | Attending: Family Medicine | Admitting: Family Medicine

## 2019-10-30 ENCOUNTER — Encounter: Payer: Self-pay | Admitting: Family Medicine

## 2019-10-30 ENCOUNTER — Other Ambulatory Visit: Payer: Self-pay

## 2019-10-30 ENCOUNTER — Ambulatory Visit: Payer: BLUE CROSS/BLUE SHIELD | Admitting: Family Medicine

## 2019-10-30 ENCOUNTER — Other Ambulatory Visit: Payer: Self-pay | Admitting: Family Medicine

## 2019-10-30 VITALS — BP 155/67 | HR 67 | Temp 97.5°F | Ht 60.0 in | Wt 209.6 lb

## 2019-10-30 DIAGNOSIS — R8290 Unspecified abnormal findings in urine: Secondary | ICD-10-CM | POA: Diagnosis not present

## 2019-10-30 DIAGNOSIS — Z6841 Body Mass Index (BMI) 40.0 and over, adult: Secondary | ICD-10-CM

## 2019-10-30 DIAGNOSIS — N924 Excessive bleeding in the premenopausal period: Secondary | ICD-10-CM | POA: Insufficient documentation

## 2019-10-30 DIAGNOSIS — N83291 Other ovarian cyst, right side: Secondary | ICD-10-CM | POA: Diagnosis not present

## 2019-10-30 DIAGNOSIS — R7309 Other abnormal glucose: Secondary | ICD-10-CM | POA: Diagnosis not present

## 2019-10-30 DIAGNOSIS — N854 Malposition of uterus: Secondary | ICD-10-CM | POA: Diagnosis not present

## 2019-10-30 DIAGNOSIS — I10 Essential (primary) hypertension: Secondary | ICD-10-CM

## 2019-10-30 DIAGNOSIS — R300 Dysuria: Secondary | ICD-10-CM | POA: Insufficient documentation

## 2019-10-30 LAB — TEST AUTHORIZATION

## 2019-10-30 LAB — COMPLETE METABOLIC PANEL WITH GFR
AG Ratio: 1.6 (calc) (ref 1.0–2.5)
ALT: 13 U/L (ref 6–29)
AST: 14 U/L (ref 10–30)
Albumin: 4.1 g/dL (ref 3.6–5.1)
Alkaline phosphatase (APISO): 89 U/L (ref 31–125)
BUN: 18 mg/dL (ref 7–25)
CO2: 29 mmol/L (ref 20–32)
Calcium: 9.1 mg/dL (ref 8.6–10.2)
Chloride: 101 mmol/L (ref 98–110)
Creat: 0.6 mg/dL (ref 0.50–1.10)
GFR, Est African American: 130 mL/min/{1.73_m2} (ref >=60)
GFR, Est Non African American: 112 mL/min/{1.73_m2} (ref >=60)
Globulin: 2.5 g/dL (calc) (ref 1.9–3.7)
Glucose, Bld: 115 mg/dL — ABNORMAL HIGH (ref 65–99)
Potassium: 4.5 mmol/L (ref 3.5–5.3)
Sodium: 138 mmol/L (ref 135–146)
Total Bilirubin: 0.6 mg/dL (ref 0.2–1.2)
Total Protein: 6.6 g/dL (ref 6.1–8.1)

## 2019-10-30 LAB — CBC WITH DIFFERENTIAL/PLATELET
Absolute Monocytes: 505 cells/uL (ref 200–950)
Basophils Absolute: 71 cells/uL (ref 0–200)
Basophils Relative: 0.7 %
Eosinophils Absolute: 152 cells/uL (ref 15–500)
Eosinophils Relative: 1.5 %
HCT: 39.5 % (ref 35.0–45.0)
Hemoglobin: 13 g/dL (ref 11.7–15.5)
Lymphs Abs: 2222 cells/uL (ref 850–3900)
MCH: 27.1 pg (ref 27.0–33.0)
MCHC: 32.9 g/dL (ref 32.0–36.0)
MCV: 82.3 fL (ref 80.0–100.0)
MPV: 9.9 fL (ref 7.5–12.5)
Monocytes Relative: 5 %
Neutro Abs: 7151 cells/uL (ref 1500–7800)
Neutrophils Relative %: 70.8 %
Platelets: 293 10*3/uL (ref 140–400)
RBC: 4.8 10*6/uL (ref 3.80–5.10)
RDW: 13.1 % (ref 11.0–15.0)
Total Lymphocyte: 22 %
WBC: 10.1 10*3/uL (ref 3.8–10.8)

## 2019-10-30 LAB — HEMOGLOBIN A1C W/OUT EAG: Hgb A1c MFr Bld: 5.9 % of total Hgb — ABNORMAL HIGH (ref <5.7)

## 2019-10-30 MED ORDER — PHENAZOPYRIDINE HCL 100 MG PO TABS: 100.0000 mg | ORAL_TABLET | Freq: Three times a day (TID) | ORAL | 0 refills | Status: AC | PRN

## 2019-10-30 MED ORDER — HYDROCHLOROTHIAZIDE 12.5 MG PO TABS: 12.5000 mg | ORAL_TABLET | Freq: Every day | ORAL | 0 refills | Status: DC

## 2019-10-30 NOTE — Patient Instructions (Addendum)
As we discussed, I have put in a referral for you to have a pelvic and transvaginal ultrasound completed.  The referral coordinator is working on getting this scheduled now.  It is important that you keep your phone on and notify your contacts [facility] [place] [facility]) that they should be receiving a telephone call to schedule your ultrasound.  We will be in touch once that has been reviewed by the radiologist.  We may be putting in a referral to OB/GYN, as we discussed, given your heavy bleeding since 10/06/2019 and your family history reported as your mother with uterine cancer requiring hysterectomy at 33.  I have sent in a prescription for pyridium to take 1 tablet up to 3x per day for the next 2 days to help with dysuria while we have your urine sent to the lab for culture testing.  We will call you when we have the results.  I have sent in a prescription for hydrochlorothiazide 12.5mg  to take daily in ADDITION to your Valsartan 40mg  daily.    Try to get exercise a minimum of 30 minutes per day at least 5 days per week as well as  adequate water intake all while measuring blood pressure a few times per week.  Keep a blood pressure log and bring back to clinic at your next visit.  If your readings are consistently over 130/80 to contact our office/send me a MyChart message and we will see you sooner.  Can try DASH and Mediterranean diet options, avoiding processed foods, lowering sodium intake, avoiding pork products, and eating a plant based diet for optimal health.  Education and discussion with patient regarding hypertension as well as the effects on the organs and body.  Specifically, we spoke about kidney disease, kidney failure, heart attack, stroke and up to and including death, as likely outcomes if non-compliant with blood pressure regulation.  Discussed how all of these habits are attached to each other and each has the effect on each other.  We will plan to see you back in 2 weeks for hypertension  follow up visit   You will receive a survey after today's visit either digitally by e-mail or paper by USPS mail. Your experiences and feedback matter to .  Please respond so we know how we are doing as we provide care for you.  Call us with any questions/concerns/needs.  It is my goal to be available to you for your health concerns.  Thanks for choosing me to be a partner in your healthcare needs!  [place], FNP-C Family Nurse Practitioner [facility] Phone: [phone]

## 2019-10-30 NOTE — Assessment & Plan Note (Signed)
Patient seen in clinic on 10/26/2019, STAT labs showing no elevation in WBC and normal results for hemoglobin/hematocrit.  Vitals remain stable, with exception of elevated blood pressure.  Denies any continued dizziness with positional changes.  Reporting changing her super maxi pads 10-12x per day with continued dysuria.  Urine culture showed contamination on 10/26/2019, with continued dysuria encouraged clean catch and will send a new culture to the lab for evaluation and will treat with pyridium for symptoms until results have returned.    Last PAP testing 09/16/2016, which was NIL and Neg for HPV.  Is due for repeat testing.  Discussed with patient, will send for STAT pelvic and transvaginal ultrasound today for evaluation of vaginal bleeding, likely will need referral to OB/GYN and patient in agreement with this.  Plan: 1. Referral for transvaginal and pelvic ultrasound STAT today at 1:45pm 2. Urine sent to the lab for culture for evaluation of dysuria 3. TBD if sending referral to OB/GYN based on TV/Pelvic US and TBD follow up plan

## 2019-10-30 NOTE — Assessment & Plan Note (Signed)
See menorrhagia A/P 

## 2019-10-30 NOTE — Progress Notes (Signed)
Subjective:    Patient ID: Brittany Pratt, female    DOB: 08/18/1975, 44 y.o.   MRN: [id number]  Brittany Pratt is a 44 y.o. female presenting on 10/30/2019 for Menorrhagia (heavy menstual, lower back pain, lower abdominal pain, heavy blood clots, and dysuria x 10/06/19.. Pt state she is changing her suoer maxi pads 10-12 times a day. )   HPI  Patient presents to clinic with concerns for continued heavy vaginal bleeding since 10/06/2019.  Reports that she has been having lower back pain, lower abdominal pain, heavy blood clots and urinary discomfort, changing her super maxi pads approximately 10-12x per day.  Reports her Mother had similar symptoms when she was 69, was diagnosed with uterine cancer and required a hysterectomy at that time.  Reports is not established with an OB/GYN and up until this menses that started on 10/06/2019 her cycles have been regular.  Depression screen [facility] 2/9 05/10/2019 04/25/2018 04/11/2018  Decreased Interest 1 0 1  Down, Depressed, Hopeless 0 0 0  PHQ - 2 Score 1 0 1  Altered sleeping - - 1  Tired, decreased energy - - 3  Change in appetite - - 0  Feeling bad or failure about yourself  - - 0  Trouble concentrating - - 1  Moving slowly or fidgety/restless - - 0  Suicidal thoughts - - 0  PHQ-9 Score - - 6  Difficult doing work/chores - - Somewhat difficult    Social History   Tobacco Use  . Smoking status: Never Smoker  . Smokeless tobacco: Never Used  Vaping Use  . Vaping Use: Never used  Substance Use Topics  . Alcohol use: No  . Drug use: No    Review of Systems  Constitutional: Negative.   HENT: Negative.   Eyes: Negative.   Respiratory: Negative.   Cardiovascular: Negative.   Gastrointestinal: Positive for abdominal pain. Negative for abdominal distention, anal bleeding, blood in stool, constipation, diarrhea, nausea, rectal pain and vomiting.  Endocrine: Negative.   Genitourinary: Positive for dysuria,  hematuria and vaginal bleeding. Negative for decreased urine volume, difficulty urinating, dyspareunia, enuresis, flank pain, frequency, genital sores, menstrual problem, pelvic pain, urgency, vaginal discharge and vaginal pain.  Musculoskeletal: Positive for back pain. Negative for arthralgias, gait problem, joint swelling, myalgias, neck pain and neck stiffness.  Skin: Negative.   Allergic/Immunologic: Negative.   Neurological: Negative.   Hematological: Negative.   Psychiatric/Behavioral: Negative.    Per HPI unless specifically indicated above     Objective:    BP (!) 155/67 (BP Location: Left Arm, Patient Position: Sitting, Cuff Size: Large)   Pulse 67   Temp (!) 97.5 F (36.4 C) (Temporal)   Ht 5' (1.524 m)   Wt 209 lb 9.6 oz (95.1 kg)   LMP 10/06/2019   SpO2 100%   BMI 40.93 kg/m   Wt Readings from Last 3 Encounters:  10/30/19 209 lb 9.6 oz (95.1 kg)  10/26/19 208 lb (94.3 kg)  05/10/19 216 lb 3.2 oz (98.1 kg)    Physical Exam Vitals reviewed.  Constitutional:      General: She is not in acute distress.    Appearance: Normal appearance. She is well-developed and well-groomed. She is morbidly obese. She is not ill-appearing or toxic-appearing.  HENT:     Head: Normocephalic and atraumatic.     Nose:     Comments: Lesia Sago is in place, covering mouth and nose. Eyes:     General: Lids are normal. Vision  grossly intact.        Right eye: No discharge.        Left eye: No discharge.     Extraocular Movements: Extraocular movements intact.     Conjunctiva/sclera: Conjunctivae normal.     Pupils: Pupils are equal, round, and reactive to light.  Cardiovascular:     Rate and Rhythm: Normal rate and regular rhythm.     Pulses: Normal pulses.     Heart sounds: Normal heart sounds. No murmur heard.  No friction rub. No gallop.   Pulmonary:     Effort: Pulmonary effort is normal. No respiratory distress.     Breath sounds: Normal breath sounds.  Abdominal:     General:  Bowel sounds are normal. There is no distension.     Palpations: Abdomen is soft.     Tenderness: There is no abdominal tenderness. There is no right CVA tenderness, left CVA tenderness, guarding or rebound.  Musculoskeletal:     Right lower leg: No edema.     Left lower leg: No edema.  Skin:    General: Skin is warm and dry.     Capillary Refill: Capillary refill takes less than 2 seconds.  Neurological:     General: No focal deficit present.     Mental Status: She is alert and oriented to person, place, and time.  Psychiatric:        Attention and Perception: Attention and perception normal.        Mood and Affect: Mood and affect normal.        Speech: Speech normal.        Behavior: Behavior normal. Behavior is cooperative.        Thought Content: Thought content normal.        Cognition and Memory: Cognition and memory normal.        Judgment: Judgment normal.    Results for orders placed or performed in visit on 10/26/19  Urine Culture   Specimen: Urine  Result Value Ref Range   MICRO NUMBER: [id number]    SPECIMEN QUALITY: Adequate    Sample Source NOT GIVEN    STATUS: FINAL    Result:      Less than 10,000 CFU/mL of single Gram positive organism isolated. No further testing will be performed. If clinically indicated, recollection using a method to minimize contamination, with prompt transfer to Urine Culture Transport Tube, is recommended.  CBC with Differential  Result Value Ref Range   WBC 10.1 3.8 - 10.8 Thousand/uL   RBC 4.80 3.80 - 5.10 Million/uL   Hemoglobin 13.0 11.7 - 15.5 g/dL   HCT 84.5 35 - 45 %   MCV 82.3 80.0 - 100.0 fL   MCH 27.1 27.0 - 33.0 pg   MCHC 32.9 32.0 - 36.0 g/dL   RDW 36.4 68.0 - 32.1 %   Platelets 293 140 - 400 Thousand/uL   MPV 9.9 7.5 - 12.5 fL   Neutro Abs 7,151 1,500 - 7,800 cells/uL   Lymphs Abs 2,222 850 - 3,900 cells/uL   Absolute Monocytes 505 200 - 950 cells/uL   Eosinophils Absolute 152 15 - 500 cells/uL   Basophils Absolute  71 0 - 200 cells/uL   Neutrophils Relative % 70.8 %   Total Lymphocyte 22.0 %   Monocytes Relative 5.0 %   Eosinophils Relative 1.5 %   Basophils Relative 0.7 %  COMPLETE METABOLIC PANEL WITH GFR  Result Value Ref Range   Glucose, Bld 115 (H) 65 -  99 mg/dL   BUN 18 7 - 25 mg/dL   Creat 6.960.60 2.950.50 - 2.841.10 mg/dL   GFR, Est Non African American 112 > OR = 60 mL/min/1.3973m2   GFR, Est African American 130 > OR = 60 mL/min/1.5073m2   BUN/Creatinine Ratio NOT APPLICABLE 6 - 22 (calc)   Sodium 138 135 - 146 mmol/L   Potassium 4.5 3.5 - 5.3 mmol/L   Chloride 101 98 - 110 mmol/L   CO2 29 20 - 32 mmol/L   Calcium 9.1 8.6 - 10.2 mg/dL   Total Protein 6.6 6.1 - 8.1 g/dL   Albumin 4.1 3.6 - 5.1 g/dL   Globulin 2.5 1.9 - 3.7 g/dL (calc)   AG Ratio 1.6 1.0 - 2.5 (calc)   Total Bilirubin 0.6 0.2 - 1.2 mg/dL   Alkaline phosphatase (APISO) 89 31 - 125 U/L   AST 14 10 - 30 U/L   ALT 13 6 - 29 U/L  Urinalysis, Routine w reflex microscopic  Result Value Ref Range   Color, Urine ORANGE (A) YELLOW   APPearance CLOUDY (A) CLEAR   Specific Gravity, Urine 1.026 1.001 - 1.03   pH 6.0 5.0 - 8.0   Glucose, UA NEGATIVE NEGATIVE   Bilirubin Urine NEGATIVE NEGATIVE   Ketones, ur TRACE (A) NEGATIVE   Hgb urine dipstick 3+ (A) NEGATIVE   Protein, ur 2+ (A) NEGATIVE   Nitrite NEGATIVE NEGATIVE   Leukocytes,Ua 1+ (A) NEGATIVE   WBC, UA 10-20 (A) 0 - 5 /HPF   RBC / HPF 3-10 (A) 0 - 2 /HPF   Squamous Epithelial / LPF 20-27 (A) < OR = 5 /HPF   Bacteria, UA MODERATE (A) NONE SEEN /HPF   Hyaline Cast NONE SEEN NONE SEEN /LPF  POCT Urinalysis Dipstick  Result Value Ref Range   Color, UA Amber    Clarity, UA Clear    Glucose, UA Negative Negative   Bilirubin, UA Negative    Ketones, UA Trace 5    Spec Grav, UA 1.025 1.010 - 1.025   Blood, UA Large 3+    pH, UA 5.0 5.0 - 8.0   Protein, UA Positive (A) Negative   Urobilinogen, UA 4.0 (A) 0.2 or 1.0 E.U./dL   Nitrite, UA Negative    Leukocytes, UA Negative  Negative   Appearance     Odor    POCT urine pregnancy  Result Value Ref Range   Preg Test, Ur Negative Negative      Assessment & Plan:   Problem List Items Addressed This Visit      Cardiovascular and Mediastinum   Essential hypertension    Uncontrolled hypertension.  BP is not at goal < 130/80.  Pt is not working on lifestyle modifications.  Taking medications tolerating well without side effects.  Complications: Morbid obesity, GERD  Plan: 1. Continue taking valsartan 40mg  daily and BEGIN taking hydrochlorothiazide 12.5mg  daily 2. Obtain labs in 6 months  3. Encouraged heart healthy diet and increasing exercise to 30 minutes most days of the week, going no more than 2 days in a row without exercise. 4. Check BP 1-2 x per day at home, keep log, and bring to clinic at next appointment. 5. Follow up 2 weeks.         Relevant Medications   hydrochlorothiazide (HYDRODIURIL) 12.5 MG tablet     Other   BMI 40.0-44.9, adult (HCC)    BMI 40.93% in clinic today.  To work towards decreasing caloric intake and increasing exercise.  We can recheck this at next follow up visit on hypertension in 2 weeks.      Menorrhagia, premenopausal - Primary    Patient seen in clinic on 10/26/2019, STAT labs showing no elevation in WBC and normal results for hemoglobin/hematocrit.  Vitals remain stable, with exception of elevated blood pressure.  Denies any continued dizziness with positional changes.  Reporting changing her super maxi pads 10-12x per day with continued dysuria.  Urine culture showed contamination on 10/26/2019, with continued dysuria encouraged clean catch and will send a new culture to the lab for evaluation and will treat with pyridium for symptoms until results have returned.    Last PAP testing 09/16/2016, which was NIL and Neg for HPV.  Is due for repeat testing.  Discussed with patient, will send for STAT pelvic and transvaginal ultrasound today for evaluation of vaginal  bleeding, likely will need referral to OB/GYN and patient in agreement with this.  Plan: 1. Referral for transvaginal and pelvic ultrasound STAT today at 1:45pm 2. Urine sent to the lab for culture for evaluation of dysuria 3. TBD if sending referral to OB/GYN based on TV/Pelvic US and TBD follow up plan      Relevant Orders   US Pelvic Complete With Transvaginal   Abnormal urinalysis    See menorrhagia A/P      Dysuria    See menorrhagia A/P      Relevant Medications   phenazopyridine (PYRIDIUM) 100 MG tablet   Other Relevant Orders   Urinalysis, microscopic only   Urine Culture      Meds ordered this encounter  Medications  . hydrochlorothiazide (HYDRODIURIL) 12.5 MG tablet    Sig: Take 1 tablet (12.5 mg total) by mouth daily.    Dispense:  30 tablet    Refill:  0  . phenazopyridine (PYRIDIUM) 100 MG tablet    Sig: Take 1 tablet (100 mg total) by mouth 3 (three) times daily as needed for up to 2 days for pain.    Dispense:  6 tablet    Refill:  0      Follow up plan: Return in about 2 weeks (around 11/13/2019) for HTN F/U and Vaginal Bleeding F/U.   Charlaine Dalton, FNP Family Nurse Practitioner [facility] 10/30/2019, 12:41 PM

## 2019-10-30 NOTE — Assessment & Plan Note (Signed)
BMI 40.93% in clinic today.  To work towards decreasing caloric intake and increasing exercise.  We can recheck this at next follow up visit on hypertension in 2 weeks.

## 2019-10-30 NOTE — Assessment & Plan Note (Signed)
Uncontrolled hypertension.  BP is not at goal < 130/80.  Pt is not working on lifestyle modifications.  Taking medications tolerating well without side effects.  Complications: Morbid obesity, GERD  Plan: 1. Continue taking valsartan 40mg  daily and BEGIN taking hydrochlorothiazide 12.5mg  daily 2. Obtain labs in 6 months  3. Encouraged heart healthy diet and increasing exercise to 30 minutes most days of the week, going no more than 2 days in a row without exercise. 4. Check BP 1-2 x per day at home, keep log, and bring to clinic at next appointment. 5. Follow up 2 weeks.

## 2019-11-01 LAB — URINE CULTURE
MICRO NUMBER:: 10840817
Result:: NO GROWTH
SPECIMEN QUALITY:: ADEQUATE

## 2019-11-01 LAB — URINALYSIS, MICROSCOPIC ONLY: Hyaline Cast: NONE SEEN /LPF

## 2019-11-01 LAB — HEMOGLOBIN A1C W/OUT EAG

## 2019-11-06 ENCOUNTER — Telehealth: Payer: Self-pay | Admitting: Obstetrics & Gynecology

## 2019-11-06 DIAGNOSIS — M5481 Occipital neuralgia: Secondary | ICD-10-CM | POA: Diagnosis not present

## 2019-11-06 DIAGNOSIS — G44209 Tension-type headache, unspecified, not intractable: Secondary | ICD-10-CM | POA: Diagnosis not present

## 2019-11-06 NOTE — Telephone Encounter (Signed)
Br[facility]any [facility]att [facility] referring for Menorrhagia since 10/06/2019, hx of family uterine cancer. Called with interpreter to schedule appointment. Patient decline to scheduled due to being at another appointment. Advise patient to give [place] a call back to be scheduled

## 2019-11-07 DIAGNOSIS — R1032 Left lower quadrant pain: Secondary | ICD-10-CM | POA: Diagnosis not present

## 2019-11-07 DIAGNOSIS — Z1151 Encounter for screening for human papillomavirus (HPV): Secondary | ICD-10-CM | POA: Diagnosis not present

## 2019-11-07 DIAGNOSIS — Z8049 Family history of malignant neoplasm of other genital organs: Secondary | ICD-10-CM | POA: Diagnosis not present

## 2019-11-07 DIAGNOSIS — Z124 Encounter for screening for malignant neoplasm of cervix: Secondary | ICD-10-CM | POA: Diagnosis not present

## 2019-11-07 DIAGNOSIS — I10 Essential (primary) hypertension: Secondary | ICD-10-CM | POA: Diagnosis not present

## 2019-11-07 DIAGNOSIS — N939 Abnormal uterine and vaginal bleeding, unspecified: Secondary | ICD-10-CM | POA: Diagnosis not present

## 2019-11-07 LAB — HM PAP SMEAR

## 2019-11-08 NOTE — Telephone Encounter (Signed)
Called and left voicemail with interpreter for patient to call back to be scheduled.

## 2019-11-15 ENCOUNTER — Other Ambulatory Visit: Payer: Self-pay | Admitting: Family Medicine

## 2019-11-21 ENCOUNTER — Other Ambulatory Visit: Payer: Self-pay | Admitting: Family Medicine

## 2019-11-21 DIAGNOSIS — I10 Essential (primary) hypertension: Secondary | ICD-10-CM

## 2019-11-21 NOTE — Telephone Encounter (Signed)
Contacted patient with interpreter to get patient schedule. Patient reports that she has already been seen by a [facility] provider and no longer needs referral

## 2020-02-12 DIAGNOSIS — M25661 Stiffness of right knee, not elsewhere classified: Secondary | ICD-10-CM | POA: Diagnosis not present

## 2020-02-12 DIAGNOSIS — M25561 Pain in right knee: Secondary | ICD-10-CM | POA: Diagnosis not present

## 2020-02-18 DIAGNOSIS — M1711 Unilateral primary osteoarthritis, right knee: Secondary | ICD-10-CM | POA: Diagnosis not present

## 2020-02-18 DIAGNOSIS — Z01812 Encounter for preprocedural laboratory examination: Secondary | ICD-10-CM | POA: Diagnosis not present

## 2020-02-19 ENCOUNTER — Other Ambulatory Visit: Payer: Self-pay | Admitting: Family Medicine

## 2020-02-19 DIAGNOSIS — M1711 Unilateral primary osteoarthritis, right knee: Secondary | ICD-10-CM | POA: Diagnosis not present

## 2020-02-19 DIAGNOSIS — Z4789 Encounter for other orthopedic aftercare: Secondary | ICD-10-CM | POA: Diagnosis not present

## 2020-02-19 DIAGNOSIS — Z96651 Presence of right artificial knee joint: Secondary | ICD-10-CM | POA: Diagnosis not present

## 2020-02-19 DIAGNOSIS — I10 Essential (primary) hypertension: Secondary | ICD-10-CM

## 2020-02-19 NOTE — Telephone Encounter (Signed)
Requested medications are due for refill today yes  Requested medications are on the active medication list yes  Last refill 9/9  Last visit 8/17, asked to come back in 2 weeks, did not and no upcoming visit scheduled.  Future visit scheduled no  Notes to clinic Please assess, it is within the 6 month protocol, however, OV requested recheck.

## 2020-02-20 DIAGNOSIS — Z79899 Other long term (current) drug therapy: Secondary | ICD-10-CM | POA: Diagnosis not present

## 2020-02-20 DIAGNOSIS — M25561 Pain in right knee: Secondary | ICD-10-CM | POA: Diagnosis not present

## 2020-02-20 DIAGNOSIS — Z710 Person encountering health services to consult on behalf of another person: Secondary | ICD-10-CM | POA: Diagnosis not present

## 2020-02-20 DIAGNOSIS — M25761 Osteophyte, right knee: Secondary | ICD-10-CM | POA: Diagnosis not present

## 2020-02-20 DIAGNOSIS — R252 Cramp and spasm: Secondary | ICD-10-CM | POA: Diagnosis not present

## 2020-02-20 DIAGNOSIS — M1711 Unilateral primary osteoarthritis, right knee: Secondary | ICD-10-CM | POA: Diagnosis not present

## 2020-02-20 DIAGNOSIS — D6489 Other specified anemias: Secondary | ICD-10-CM | POA: Diagnosis not present

## 2020-02-20 DIAGNOSIS — L299 Pruritus, unspecified: Secondary | ICD-10-CM | POA: Diagnosis not present

## 2020-02-20 DIAGNOSIS — G8918 Other acute postprocedural pain: Secondary | ICD-10-CM | POA: Diagnosis not present

## 2020-02-20 DIAGNOSIS — I10 Essential (primary) hypertension: Secondary | ICD-10-CM | POA: Diagnosis not present

## 2020-02-21 DIAGNOSIS — Z79899 Other long term (current) drug therapy: Secondary | ICD-10-CM | POA: Diagnosis not present

## 2020-02-21 DIAGNOSIS — M25761 Osteophyte, right knee: Secondary | ICD-10-CM | POA: Diagnosis not present

## 2020-02-21 DIAGNOSIS — R252 Cramp and spasm: Secondary | ICD-10-CM | POA: Diagnosis not present

## 2020-02-21 DIAGNOSIS — M1711 Unilateral primary osteoarthritis, right knee: Secondary | ICD-10-CM | POA: Diagnosis not present

## 2020-02-21 DIAGNOSIS — I10 Essential (primary) hypertension: Secondary | ICD-10-CM | POA: Diagnosis not present

## 2020-02-21 DIAGNOSIS — D6489 Other specified anemias: Secondary | ICD-10-CM | POA: Diagnosis not present

## 2020-02-21 DIAGNOSIS — L299 Pruritus, unspecified: Secondary | ICD-10-CM | POA: Diagnosis not present

## 2020-02-21 DIAGNOSIS — Z710 Person encountering health services to consult on behalf of another person: Secondary | ICD-10-CM | POA: Diagnosis not present

## 2020-02-22 DIAGNOSIS — M25761 Osteophyte, right knee: Secondary | ICD-10-CM | POA: Diagnosis not present

## 2020-02-22 DIAGNOSIS — Z79899 Other long term (current) drug therapy: Secondary | ICD-10-CM | POA: Diagnosis not present

## 2020-02-22 DIAGNOSIS — R252 Cramp and spasm: Secondary | ICD-10-CM | POA: Diagnosis not present

## 2020-02-22 DIAGNOSIS — Z710 Person encountering health services to consult on behalf of another person: Secondary | ICD-10-CM | POA: Diagnosis not present

## 2020-02-22 DIAGNOSIS — L299 Pruritus, unspecified: Secondary | ICD-10-CM | POA: Diagnosis not present

## 2020-02-22 DIAGNOSIS — D6489 Other specified anemias: Secondary | ICD-10-CM | POA: Diagnosis not present

## 2020-02-22 DIAGNOSIS — I10 Essential (primary) hypertension: Secondary | ICD-10-CM | POA: Diagnosis not present

## 2020-02-22 DIAGNOSIS — M1711 Unilateral primary osteoarthritis, right knee: Secondary | ICD-10-CM | POA: Diagnosis not present

## 2020-02-27 ENCOUNTER — Ambulatory Visit
Admission: RE | Admit: 2020-02-27 | Discharge: 2020-02-27 | Disposition: A | Discharge status: Home / Self Care | Payer: BLUE CROSS/BLUE SHIELD | Source: Ambulatory Visit | Attending: Orthopedic Surgery | Admitting: Orthopedic Surgery

## 2020-02-27 ENCOUNTER — Other Ambulatory Visit: Payer: Self-pay | Admitting: Orthopedic Surgery

## 2020-02-27 DIAGNOSIS — M25561 Pain in right knee: Secondary | ICD-10-CM | POA: Diagnosis not present

## 2020-02-27 DIAGNOSIS — M25661 Stiffness of right knee, not elsewhere classified: Secondary | ICD-10-CM | POA: Diagnosis not present

## 2020-02-27 DIAGNOSIS — Z96651 Presence of right artificial knee joint: Secondary | ICD-10-CM

## 2020-02-27 DIAGNOSIS — R2241 Localized swelling, mass and lump, right lower limb: Secondary | ICD-10-CM | POA: Diagnosis not present

## 2020-04-01 DIAGNOSIS — R2241 Localized swelling, mass and lump, right lower limb: Secondary | ICD-10-CM | POA: Diagnosis not present

## 2020-04-01 DIAGNOSIS — Z96651 Presence of right artificial knee joint: Secondary | ICD-10-CM | POA: Diagnosis not present

## 2020-05-13 DIAGNOSIS — R2241 Localized swelling, mass and lump, right lower limb: Secondary | ICD-10-CM | POA: Diagnosis not present

## 2020-05-13 DIAGNOSIS — Z96651 Presence of right artificial knee joint: Secondary | ICD-10-CM | POA: Diagnosis not present

## 2021-01-05 ENCOUNTER — Encounter: Payer: Self-pay | Admitting: Internal Medicine

## 2021-01-05 ENCOUNTER — Ambulatory Visit: Payer: BLUE CROSS/BLUE SHIELD | Admitting: Internal Medicine

## 2021-01-05 ENCOUNTER — Ambulatory Visit: Payer: Self-pay | Admitting: *Deleted

## 2021-01-05 ENCOUNTER — Other Ambulatory Visit: Payer: Self-pay

## 2021-01-05 VITALS — BP 139/80 | HR 80 | Temp 98.2°F | Resp 18 | Ht 60.0 in | Wt 188.4 lb

## 2021-01-05 DIAGNOSIS — N920 Excessive and frequent menstruation with regular cycle: Secondary | ICD-10-CM

## 2021-01-05 NOTE — Patient Instructions (Signed)
Menorragia Menorrhagia Se llama menorragia cuando los perodos mensuales son muy abundantes o duran ms de lo normal. Si tiene esta afeccin, el sangrado y los clicos pueden hacer que le resulte difcil realizar sus actividades diarias. Cules son las causas? Las causas ms frecuentes de esta afeccin incluyen las siguientes: Crecimientos en el tero. Estos son plipos o fibromas. Estos crecimientos no son cncer. Problemas con dos hormonas, llamadas estrgeno y progesterona. Uno de los ovarios no libera vulos durante uno o ms meses. Un problema con la glndula tiroidea. Tener un dispositivo anticonceptivo (DIU). Efectos secundarios de algunos medicamentos, como antiinflamatorios no esteroideos (AINE) o anticoagulantes. Un trastorno que impide que la sangre coagule de manera normal. Qu incrementa el riesgo? Es ms probable que desarrolle esta afeccin si usted tiene cncer de tero. Cules son los signos o sntomas? Tiene que cambiar el apsito o el tampn cada 1 o 2 horas debido a que est empapado. Necesita usar apsitos y tampones al mismo tiempo porque pierde demasiada sangre. Debe levantarse para cambiarse el apsito o el tampn durante la noche. Elimina cogulos ms grandes que 1 pulgada (2.5 cm). El sangrado dura ms de 7 das. Tiene sntomas de niveles bajos de hierro (anemia), como cansancio o falta de aire. Cmo se trata? Es posible que no necesite tratamiento para esta afeccin. Pero si necesita tratamiento, es posible que le den medicamentos: Para reducir el sangrado durante el perodo menstrual. Estos incluyen los medicamentos anticonceptivos. Para hacer que la sangre se espese. Esto hace ms lento el sangrado. Para reducir la hinchazn. Los medicamentos que hacen esto incluyen al ibuprofeno. Que tienen una hormona llamada progestina. Que hacen que los ovarios dejen de funcionar durante un breve lapso. Para tratar los niveles bajos de hierro. Si tiene esta afeccin,  le darn pldoras de hierro. Si los medicamentos no resultan eficaces, puede ser necesaria una ciruga. Se puede realizar una ciruga para lo siguiente: Retirar una parte del revestimiento del tero. Este revestimiento se denomina endometrio. Esto reduce el sangrado durante un perodo. Eliminar los crecimientos en el tero. Estos pueden ser plipos o fibromas. Retirar todo el revestimiento del tero. Retirar el tero por completo. Este procedimiento se denomina histerectoma. Siga estas instrucciones en su casa: Medicamentos Use los medicamentos de venta libre y los recetados solamente como se lo haya indicado el mdico. Esto incluye las pldoras de hierro. No cambie ni reemplace los medicamentos sin consultarlo con su mdico. No tome aspirina ni medicamentos que contengan aspirina a partir de 1 semana antes ni durante su perodo. La aspirina puede hacer que el sangrado empeore. Control del estreimiento Las pldoras de hierro pueden causar problemas para defecar (estreimiento). Con el fin de prevenir o tratar los problemas para defecar, es posible que deba hacer lo siguiente: Beba suficiente lquido para mantener el pis (orina) de color amarillo plido. Usar medicamentos recetados o de venta libre. Comer alimentos ricos en fibra. Entre ellos, frijoles, cereales integrales y frutas y verduras frescas. Limitar los alimentos con alto contenido de grasa y azcar. Estos incluyen alimentos fritos o dulces. Instrucciones generales Si necesita cambiar el apsito o el tampn ms de una vez cada 2 horas, limite su actividad hasta que el sangrado se detenga. Tome comidas saludables y alimentos ricos en hierro. Los alimentos que tienen mucho hierro incluyen los siguientes: Verduras de hoja verde. Carne. Hgado. Huevos. Panes y cereales integrales. No intente adelgazar hasta que el sangrado abundante se haya detenido y tenga cantidades normales de hierro en la sangre. Si necesita adelgazar,   consulte a  su mdico. Cumpla con todas las visitas de seguimiento. Comunquese con un mdico si: Empapa un tampn o un apsito cada 1 o 2 horas, y esto le ocurre cada vez que tiene el perodo. Necesita usar apsitos y tampones al mismo tiempo porque pierde demasiada sangre. Est tomando medicamentos y tiene lo siguiente: Siente que va a vomitar. Vomita. Presenta heces lquidas (diarrea). Algn problema que pueda relacionarse con el medicamento que est tomando. Solicite ayuda de inmediato si: Empapa ms de un apsito o un tampn en 1 hora. Elimina cogulos ms grandes que 1 pulgada (2.5 cm). Siente que le falta el aire. Siente que el corazn late demasiado rpido. Se siente mareada o se desmaya. Se siente muy dbil o cansada. Resumen Se llama menorragia cuando los perodos menstruales son muy abundantes o duran ms de lo normal. Es posible que no necesite tratamiento para esta afeccin. Si necesita tratamiento, puede ser que le administren medicamentos o que le realicen una ciruga. Use los medicamentos de venta libre y los recetados solamente como se lo haya indicado el mdico. Esto incluye las pldoras de hierro. Obtenga ayuda de inmediato si empapa ms de un apsito o un tampn en 1 hora o si elimina cogulos grandes. Tambin obtenga ayuda de inmediato si se siente mareada, muy dbil, cansada o le falta el aire. Esta informacin no tiene como fin reemplazar el consejo del mdico. Asegrese de hacerle al mdico cualquier pregunta que tenga. Document Revised: 01/11/2020 Document Reviewed: 01/11/2020 Elsevier Patient Education  2022 [employer]  

## 2021-01-05 NOTE — Progress Notes (Signed)
Subjective:    Patient ID: Brittany Pratt, female    DOB: 24-Dec-1975, 45 y.o.   MRN: [id number]  HPI  Patient presents to clinic today with complaint of heavy vaginal bleeding.  She reports this started yesterday.  She reports pelvic cramping, heavy bleeding with blood clots, nausea, sweating, dizziness and chills.  She reports she was changing her pad 5 times every hour but this has improved since yesterday.  She has been seen for this in the past.  Pelvic ultrasound from 10/2019 reviewed showed 2 ovarian cysts, otherwise unremarkable.  She reports she has normal periods.  She denies any concern for pregnancy at this time.  Review of Systems     Past Medical History:  Diagnosis Date   Healthy adult on routine physical examination    Hypertension     Current Outpatient Medications  Medication Sig Dispense Refill   Acetaminophen (TYLENOL PO) Take 1,000 mg by mouth as needed.     diclofenac (VOLTAREN) 50 MG EC tablet TAKE 1 TABLET BY MOUTH TWICE A DAY 60 tablet 1   hydrochlorothiazide (HYDRODIURIL) 12.5 MG tablet TAKE 1 TABLET BY MOUTH EVERY DAY 90 tablet 0   ibuprofen (ADVIL) 600 MG tablet Take 1 tablet (600 mg total) by mouth every 8 (eight) hours as needed. 30 tablet 0   traMADol (ULTRAM) 50 MG tablet tramadol 50 mg tablet  Take 1 tablet every 6 hours by oral route.     valsartan (DIOVAN) 40 MG tablet Take 1 tablet (40 mg total) by mouth daily. 90 tablet 3   No current facility-administered medications for this visit.    Allergies  Allergen Reactions   Ace Inhibitors Cough    Family History  Problem Relation Age of Onset   Cancer Mother        stomach, Uterine   Diabetes Mother    Heart disease Son        aortic valve replacement at 74 yo   Hyperlipidemia Maternal Aunt    Hypertension Maternal Aunt    Diabetes Maternal Aunt    Hypertension Maternal Uncle    Hyperlipidemia Maternal Uncle    Diabetes Maternal Uncle     Social History   Socioeconomic  History   Marital status: Divorced    Spouse name: Not on file   Number of children: Not on file   Years of education: Not on file   Highest education level: Not on file  Occupational History   Not on file  Tobacco Use   Smoking status: Never   Smokeless tobacco: Never  Vaping Use   Vaping Use: Never used  Substance and Sexual Activity   Alcohol use: No   Drug use: No   Sexual activity: Not Currently  Other Topics Concern   Not on file  Social History Narrative   Not on file   Social Determinants of Health   Financial Resource Strain: Not on file  Food Insecurity: Not on file  Transportation Needs: Not on file  Physical Activity: Not on file  Stress: Not on file  Social Connections: Not on file  Intimate Partner Violence: Not on file     Constitutional: Pt reports fatigue, Denies fever, malaise, headache or abrupt weight changes.  Respiratory: Denies difficulty breathing, shortness of breath, cough or sputum production.   Cardiovascular: Denies chest pain, chest tightness, palpitations or swelling in the hands or feet.  Gastrointestinal: Pt reports nausea, pelvic pain. Denies abdominal pain, bloating, constipation, diarrhea or blood in the  stool.  GU: Patient reports heavy vaginal bleeding.  Denies urgency, frequency, pain with urination, burning sensation, blood in urine, odor or discharge. Neurological: Pt reports dizziness. Denies difficulty with memory, difficulty with speech or problems with balance and coordination.    No other specific complaints in a complete review of systems (except as listed in HPI above).  Objective:   Physical Exam  BP 139/80 (BP Location: Right Arm, Patient Position: Sitting, Cuff Size: Large)   Pulse 80   Temp 98.2 F (36.8 C) (Temporal)   Resp 18   Ht 5' (1.524 m)   Wt 188 lb 6.4 oz (85.5 kg)   LMP 01/04/2021   SpO2 99%   BMI 36.79 kg/m   Wt Readings from Last 3 Encounters:  10/30/19 209 lb 9.6 oz (95.1 kg)  10/26/19 208 lb  (94.3 kg)  05/10/19 216 lb 3.2 oz (98.1 kg)    General: Appears her stated age, obese, in NAD. Neck:  Neck supple, trachea midline. No masses, lumps or thyromegaly present.  Cardiovascular: Normal rate and rhythm. S1,S2 noted.  No murmur, rubs or gallops noted. Pulmonary/Chest: Normal effort and positive vesicular breath sounds. No respiratory distress. No wheezes, rales or ronchi noted.  Abdomen: Soft and tender in the LLQ and RLQ. Normal bowel sounds. No distention or masses noted.  Musculoskeletal: .    BMET    Component Value Date/Time   NA 138 10/26/2019 1341   NA 137 05/23/2019 0824   K 4.5 10/26/2019 1341   CL 101 10/26/2019 1341   CO2 29 10/26/2019 1341   GLUCOSE 115 (H) 10/26/2019 1341   BUN 18 10/26/2019 1341   BUN 11 05/23/2019 0824   CREATININE 0.60 10/26/2019 1341   CALCIUM 9.1 10/26/2019 1341   GFRNONAA 112 10/26/2019 1341   GFRAA 130 10/26/2019 1341    Lipid Panel     Component Value Date/Time   CHOL 203 (H) 05/23/2019 0824   TRIG 115 05/23/2019 0824   HDL 56 05/23/2019 0824   CHOLHDL 3.3 04/25/2018 0948   VLDL 11 09/13/2016 0911   LDLCALC 127 (H) 05/23/2019 0824   LDLCALC 117 (H) 04/25/2018 0948    CBC    Component Value Date/Time   WBC 10.1 10/26/2019 1341   RBC 4.80 10/26/2019 1341   HGB 13.0 10/26/2019 1341   HGB 12.8 05/23/2019 0824   HCT 39.5 10/26/2019 1341   HCT 38.0 05/23/2019 0824   PLT 293 10/26/2019 1341   PLT 262 05/23/2019 0824   MCV 82.3 10/26/2019 1341   MCV 83 05/23/2019 0824   MCH 27.1 10/26/2019 1341   MCHC 32.9 10/26/2019 1341   RDW 13.1 10/26/2019 1341   RDW 13.0 05/23/2019 0824   LYMPHSABS 2,222 10/26/2019 1341   LYMPHSABS 1.9 05/23/2019 0824   MONOABS 0.7 11/11/2014 0410   EOSABS 152 10/26/2019 1341   EOSABS 0.1 05/23/2019 0824   BASOSABS 71 10/26/2019 1341   BASOSABS 0.1 05/23/2019 0824    Hgb A1C Lab Results  Component Value Date   HGBA1C CANCELED 10/30/2019            Assessment & Plan:    Menorrhagia:  Will check CBC, BMET and TSH today Will obtain STAT pelvic/transvaginal ultrasound  If you are any worse, go to ER for evaluation. Will follow up after imaging with further recommendations and treatment plan  Nicki Reaper, NP This visit occurred during the SARS-CoV-2 public health emergency.  Safety protocols were in place, including screening questions prior to the visit,  additional usage of staff PPE, and extensive cleaning of exam room while observing appropriate contact time as indicated for disinfecting solutions.

## 2021-01-05 NOTE — Telephone Encounter (Signed)
Reason for Disposition  MODERATE vaginal bleeding (e.g., soaking 1 pad or tampon per hour and present > 6 hours; 1 menstrual cup every 6 hours)  Answer Assessment - Initial Assessment Questions 1. AMOUNT: "Describe the bleeding that you are having."    - SPOTTING: spotting, or pinkish / brownish mucous discharge; does not fill panty liner or pad    - MILD:  less than 1 pad / hour; less than patient's usual menstrual bleeding   - MODERATE: 1-2 pads / hour; 1 menstrual cup every 6 hours; small-medium blood clots (e.g., pea, grape, small coin)   - SEVERE: soaking 2 or more pads/hour for 2 or more hours; 1 menstrual cup every 2 hours; bleeding not contained by pads or continuous red blood from vagina; large blood clots (e.g., golf ball, large coin)      Patient reports- using pads- was soaking pads yesterday-severe, no clots, 1-2 pads/hour this morning 2. ONSET: "When did the bleeding begin?" "Is it continuing now?"     yesterday 3. MENSTRUAL PERIOD: "When was the last normal menstrual period?" "How is this different than your period?"     12/04/20- much heavier 4. REGULARITY: "How regular are your periods?"     yes 5. ABDOMINAL PAIN: "Do you have any pain?" "How bad is the pain?"  (e.g., Scale 1-10; mild, moderate, or severe)   - MILD (1-3): doesn't interfere with normal activities, abdomen soft and not tender to touch    - MODERATE (4-7): interferes with normal activities or awakens from sleep, abdomen tender to touch    - SEVERE (8-10): excruciating pain, doubled over, unable to do any normal activities      Yes-moderate-7 6. PREGNANCY: "Could you be pregnant?" "Are you sexually active?" "Did you recently give birth?"     No- not sexually active 7. BREASTFEEDING: "Are you breastfeeding?"     N/a 8. HORMONES: "Are you taking any hormone medications, prescription or OTC?" (e.g., birth control pills, estrogen)     no 9. BLOOD THINNERS: "Do you take any blood thinners?" (e.g., Coumadin/warfarin,  Pradaxa/dabigatran, aspirin)     no 10. CAUSE: "What do you think is causing the bleeding?" (e.g., recent gyn surgery, recent gyn procedure; known bleeding disorder, cervical cancer, polycystic ovarian disease, fibroids)         Perimenopause - 1 year ago-diagnosed with ovarian cyst 11. HEMODYNAMIC STATUS: "Are you weak or feeling lightheaded?" If Yes, ask: "Can you stand and walk normally?"        lightheaded 12. OTHER SYMPTOMS: "What other symptoms are you having with the bleeding?" (e.g., passed tissue, vaginal discharge, fever, menstrual-type cramps)       Cramping  Protocols used: Vaginal Bleeding - Abnormal-A-AH

## 2021-01-05 NOTE — Telephone Encounter (Signed)
Patient reports her period started yesterday with heavy bleeding and clots- bled through pads. Patient reports the clot have stopped - but the bleeding is still fairly heavy and she has abdominal cramping- moderate. Patient states she does feel weak with some dizziness. Patient states her periods are regular- normally not this heavy. Patient does have history of ovarian cyst.Appointment scheduled for evaluation of bleeding. Interpreter: [phone]

## 2021-01-06 ENCOUNTER — Ambulatory Visit
Admission: RE | Admit: 2021-01-06 | Discharge: 2021-01-06 | Disposition: A | Discharge status: Home / Self Care | Payer: BLUE CROSS/BLUE SHIELD | Source: Ambulatory Visit | Attending: Internal Medicine | Admitting: Internal Medicine

## 2021-01-06 DIAGNOSIS — N83291 Other ovarian cyst, right side: Secondary | ICD-10-CM | POA: Diagnosis not present

## 2021-01-06 DIAGNOSIS — N920 Excessive and frequent menstruation with regular cycle: Secondary | ICD-10-CM | POA: Diagnosis not present

## 2021-01-06 LAB — CBC
HCT: 42.3 % (ref 35.0–45.0)
Hemoglobin: 13.8 g/dL (ref 11.7–15.5)
MCH: 27 pg (ref 27.0–33.0)
MCHC: 32.6 g/dL (ref 32.0–36.0)
MCV: 82.6 fL (ref 80.0–100.0)
MPV: 11.1 fL (ref 7.5–12.5)
Platelets: 279 10*3/uL (ref 140–400)
RBC: 5.12 10*6/uL — ABNORMAL HIGH (ref 3.80–5.10)
RDW: 12.7 % (ref 11.0–15.0)
WBC: 9.3 10*3/uL (ref 3.8–10.8)

## 2021-01-06 LAB — BASIC METABOLIC PANEL
BUN: 21 mg/dL (ref 7–25)
CO2: 31 mmol/L (ref 20–32)
Calcium: 10.5 mg/dL — ABNORMAL HIGH (ref 8.6–10.2)
Chloride: 102 mmol/L (ref 98–110)
Creat: 0.84 mg/dL (ref 0.50–0.99)
Glucose, Bld: 112 mg/dL (ref 65–139)
Potassium: 4 mmol/L (ref 3.5–5.3)
Sodium: 142 mmol/L (ref 135–146)

## 2021-01-06 LAB — TSH: TSH: 1.89 mIU/L

## 2021-01-08 ENCOUNTER — Telehealth: Payer: Self-pay

## 2021-01-08 NOTE — Telephone Encounter (Signed)
Copied from CRM [id number]. Topic: General - Other >> Jan 08, 2021 12:09 PM Jaquita Rector A wrote: Ph# Reason for CRM: Patient son Elita Quick called in to say that mother is still not feeling well enough to go back to work have scheduled to see Nicki Reaper on 01/13/21 and asking for an extension from going back to work until next visit and then to be evaluated from there on. Please advise call Jose at  [phone]

## 2021-01-09 NOTE — Telephone Encounter (Signed)
This is in reference to the vaginal bleeding?.  Her labs and ultrasound were normal.  I advised her that she needs to follow-up with GYN, not me.  Okay to extend work note until 01/13/2021

## 2021-01-12 NOTE — Telephone Encounter (Signed)
The pt is actually back at work and only requesting a work note to excuse her for last Thursday, Oct 27th.

## 2021-01-13 ENCOUNTER — Other Ambulatory Visit: Payer: Self-pay

## 2021-01-13 ENCOUNTER — Encounter: Payer: Self-pay | Admitting: Internal Medicine

## 2021-01-13 ENCOUNTER — Ambulatory Visit: Payer: BLUE CROSS/BLUE SHIELD | Admitting: Internal Medicine

## 2021-01-13 DIAGNOSIS — E66812 Obesity, class 2: Secondary | ICD-10-CM | POA: Insufficient documentation

## 2021-01-13 DIAGNOSIS — N924 Excessive bleeding in the premenopausal period: Secondary | ICD-10-CM | POA: Diagnosis not present

## 2021-01-13 DIAGNOSIS — Z6837 Body mass index (BMI) 37.0-37.9, adult: Secondary | ICD-10-CM

## 2021-01-13 DIAGNOSIS — I10 Essential (primary) hypertension: Secondary | ICD-10-CM

## 2021-01-13 DIAGNOSIS — E6609 Other obesity due to excess calories: Secondary | ICD-10-CM | POA: Insufficient documentation

## 2021-01-13 NOTE — Assessment & Plan Note (Signed)
Reviewed lab results and pelvic imaging with patient She is not interested in hormonal therapy at this time She prefers to watch and wait, and will notify me if symptoms persist or worsen

## 2021-01-13 NOTE — Assessment & Plan Note (Signed)
Stable off medication Will d/c Valsartan and HCTZ Reinforced DASH diet and exercise for weight loss

## 2021-01-13 NOTE — Progress Notes (Signed)
Subjective:    Patient ID: Brittany Pratt, female    DOB: 1975-06-13, 45 y.o.   MRN: [id number]  HPI  Pt presents to the clinic today for follow up of menorrhagia. She reports she has been experiencing very heaving bleeding lately. Her recent labs did not show any evidence of thyroid disorder or anemia. Pelvic/transvaginal ultrasound showed a simple right ovarian cyst but otherwise unremarkable. Her last pap smear was 10/2019. She has been on hormonal therapy in the past but did not like it because it caused weight gain. She does not smoke. She has not history of breast, uterine, ovarian or vulvar cancer.  Of note, her BP today is 117/65. She has not taken Valsartan HCT in many months.  Review of Systems     Past Medical History:  Diagnosis Date   Healthy adult on routine physical examination    Hypertension     Current Outpatient Medications  Medication Sig Dispense Refill   Acetaminophen (TYLENOL PO) Take 1,000 mg by mouth as needed. (Patient not taking: Reported on 01/05/2021)     diclofenac (VOLTAREN) 50 MG EC tablet TAKE 1 TABLET BY MOUTH TWICE A DAY (Patient not taking: Reported on 01/05/2021) 60 tablet 1   hydrochlorothiazide (HYDRODIURIL) 12.5 MG tablet TAKE 1 TABLET BY MOUTH EVERY DAY (Patient not taking: Reported on 01/05/2021) 90 tablet 0   ibuprofen (ADVIL) 600 MG tablet Take 1 tablet (600 mg total) by mouth every 8 (eight) hours as needed. (Patient not taking: Reported on 01/05/2021) 30 tablet 0   traMADol (ULTRAM) 50 MG tablet tramadol 50 mg tablet  Take 1 tablet every 6 hours by oral route. (Patient not taking: Reported on 01/05/2021)     valsartan (DIOVAN) 40 MG tablet Take 1 tablet (40 mg total) by mouth daily. (Patient not taking: Reported on 01/05/2021) 90 tablet 3   No current facility-administered medications for this visit.    Allergies  Allergen Reactions   Ace Inhibitors Cough    Family History  Problem Relation Age of Onset   Cancer  Mother        stomach, Uterine   Diabetes Mother    Heart disease Son        aortic valve replacement at 54 yo   Hyperlipidemia Maternal Aunt    Hypertension Maternal Aunt    Diabetes Maternal Aunt    Hypertension Maternal Uncle    Hyperlipidemia Maternal Uncle    Diabetes Maternal Uncle     Social History   Socioeconomic History   Marital status: Divorced    Spouse name: Not on file   Number of children: Not on file   Years of education: Not on file   Highest education level: Not on file  Occupational History   Not on file  Tobacco Use   Smoking status: Never   Smokeless tobacco: Never  Vaping Use   Vaping Use: Never used  Substance and Sexual Activity   Alcohol use: No   Drug use: No   Sexual activity: Not Currently  Other Topics Concern   Not on file  Social History Narrative   Not on file   Social Determinants of Health   Financial Resource Strain: Not on file  Food Insecurity: Not on file  Transportation Needs: Not on file  Physical Activity: Not on file  Stress: Not on file  Social Connections: Not on file  Intimate Partner Violence: Not on file     Constitutional: Denies fever, malaise, fatigue, headache or abrupt  weight changes.  Respiratory: Denies difficulty breathing, shortness of breath, cough or sputum production.   Cardiovascular: Denies chest pain, chest tightness, palpitations or swelling in the hands or feet.  Gastrointestinal: Denies abdominal pain, bloating, constipation, diarrhea or blood in the stool.  GU: Pt reports heavy menstrual bleeding. Denies urgency, frequency, pain with urination, burning sensation, blood in urine, odor or discharge. Neurological: Denies dizziness, difficulty with memory, difficulty with speech or problems with balance and coordination.    No other specific complaints in a complete review of systems (except as listed in HPI above).  Objective:   Physical Exam  BP 117/65 (BP Location: Right Arm, Patient  Position: Sitting, Cuff Size: Large)   Pulse 85   Temp 98.7 F (37.1 C) (Temporal)   Resp 18   Ht 5' (1.524 m)   Wt 191 lb (86.6 kg)   LMP 01/04/2021   SpO2 100%   BMI 37.30 kg/m   Wt Readings from Last 3 Encounters:  01/05/21 188 lb 6.4 oz (85.5 kg)  10/30/19 209 lb 9.6 oz (95.1 kg)  10/26/19 208 lb (94.3 kg)    General: Appears her stated age, obese, in NAD. Cardiovascular: Normal rate and rhythm.  Pulmonary/Chest: Normal effort and positive vesicular breath sounds.  Neurological: Alert and oriented.   BMET    Component Value Date/Time   NA 142 01/05/2021 1555   NA 137 05/23/2019 0824   K 4.0 01/05/2021 1555   CL 102 01/05/2021 1555   CO2 31 01/05/2021 1555   GLUCOSE 112 01/05/2021 1555   BUN 21 01/05/2021 1555   BUN 11 05/23/2019 0824   CREATININE 0.84 01/05/2021 1555   CALCIUM 10.5 (H) 01/05/2021 1555   GFRNONAA 112 10/26/2019 1341   GFRAA 130 10/26/2019 1341    Lipid Panel     Component Value Date/Time   CHOL 203 (H) 05/23/2019 0824   TRIG 115 05/23/2019 0824   HDL 56 05/23/2019 0824   CHOLHDL 3.3 04/25/2018 0948   VLDL 11 09/13/2016 0911   LDLCALC 127 (H) 05/23/2019 0824   LDLCALC 117 (H) 04/25/2018 0948    CBC    Component Value Date/Time   WBC 9.3 01/05/2021 1555   RBC 5.12 (H) 01/05/2021 1555   HGB 13.8 01/05/2021 1555   HGB 12.8 05/23/2019 0824   HCT 42.3 01/05/2021 1555   HCT 38.0 05/23/2019 0824   PLT 279 01/05/2021 1555   PLT 262 05/23/2019 0824   MCV 82.6 01/05/2021 1555   MCV 83 05/23/2019 0824   MCH 27.0 01/05/2021 1555   MCHC 32.6 01/05/2021 1555   RDW 12.7 01/05/2021 1555   RDW 13.0 05/23/2019 0824   LYMPHSABS 2,222 10/26/2019 1341   LYMPHSABS 1.9 05/23/2019 0824   MONOABS 0.7 11/11/2014 0410   EOSABS 152 10/26/2019 1341   EOSABS 0.1 05/23/2019 0824   BASOSABS 71 10/26/2019 1341   BASOSABS 0.1 05/23/2019 0824    Hgb A1C Lab Results  Component Value Date   HGBA1C CANCELED 10/30/2019           Assessment & Plan:      Nicki Reaper, NP This visit occurred during the SARS-CoV-2 public health emergency.  Safety protocols were in place, including screening questions prior to the visit, additional usage of staff PPE, and extensive cleaning of exam room while observing appropriate contact time as indicated for disinfecting solutions.

## 2021-01-13 NOTE — Assessment & Plan Note (Signed)
Encouraged diet and exercise for weight loss ?

## 2022-06-16 ENCOUNTER — Ambulatory Visit (LOCAL_COMMUNITY_HEALTH_CENTER): Payer: BLUE CROSS/BLUE SHIELD

## 2022-06-16 ENCOUNTER — Ambulatory Visit: Payer: BLUE CROSS/BLUE SHIELD

## 2022-06-16 DIAGNOSIS — Z23 Encounter for immunization: Secondary | ICD-10-CM

## 2022-06-16 DIAGNOSIS — Z719 Counseling, unspecified: Secondary | ICD-10-CM

## 2022-06-16 NOTE — Progress Notes (Signed)
Pt in nurse clinic requested Covid vaccine needs for Immigration, pt preferred Moderna. Eligible, administered Moderna SpikeVax 12y+, yr 2023-2024. Monitored for 15 minutes without problems. Given VIS and NCIR copy, explained and understood. M.Taeden Geller, LPN.

## 2022-08-07 DIAGNOSIS — R051 Acute cough: Secondary | ICD-10-CM | POA: Diagnosis not present

## 2022-08-07 DIAGNOSIS — J40 Bronchitis, not specified as acute or chronic: Secondary | ICD-10-CM | POA: Diagnosis not present

## 2022-08-07 DIAGNOSIS — Z03818 Encounter for observation for suspected exposure to other biological agents ruled out: Secondary | ICD-10-CM | POA: Diagnosis not present

## 2022-12-07 DIAGNOSIS — R208 Other disturbances of skin sensation: Secondary | ICD-10-CM | POA: Diagnosis not present

## 2023-07-14 ENCOUNTER — Other Ambulatory Visit: Payer: Self-pay

## 2023-07-14 ENCOUNTER — Encounter: Payer: Self-pay | Admitting: Intensive Care

## 2023-07-14 ENCOUNTER — Emergency Department: Payer: Worker's Compensation

## 2023-07-14 ENCOUNTER — Emergency Department

## 2023-07-14 ENCOUNTER — Emergency Department
Admission: EM | Admit: 2023-07-14 | Discharge: 2023-07-14 | Disposition: A | Discharge status: Home / Self Care | Payer: Worker's Compensation | Attending: Emergency Medicine | Admitting: Emergency Medicine

## 2023-07-14 DIAGNOSIS — M545 Low back pain, unspecified: Secondary | ICD-10-CM | POA: Diagnosis present

## 2023-07-14 DIAGNOSIS — M25561 Pain in right knee: Secondary | ICD-10-CM | POA: Diagnosis not present

## 2023-07-14 DIAGNOSIS — Y990 Civilian activity done for income or pay: Secondary | ICD-10-CM | POA: Diagnosis not present

## 2023-07-14 DIAGNOSIS — Z96651 Presence of right artificial knee joint: Secondary | ICD-10-CM | POA: Insufficient documentation

## 2023-07-14 DIAGNOSIS — W010XXA Fall on same level from slipping, tripping and stumbling without subsequent striking against object, initial encounter: Secondary | ICD-10-CM | POA: Insufficient documentation

## 2023-07-14 DIAGNOSIS — Y9301 Activity, walking, marching and hiking: Secondary | ICD-10-CM | POA: Diagnosis not present

## 2023-07-14 DIAGNOSIS — Z042 Encounter for examination and observation following work accident: Secondary | ICD-10-CM

## 2023-07-14 DIAGNOSIS — R202 Paresthesia of skin: Secondary | ICD-10-CM | POA: Insufficient documentation

## 2023-07-14 DIAGNOSIS — M25512 Pain in left shoulder: Secondary | ICD-10-CM | POA: Insufficient documentation

## 2023-07-14 DIAGNOSIS — S39012A Strain of muscle, fascia and tendon of lower back, initial encounter: Secondary | ICD-10-CM | POA: Diagnosis not present

## 2023-07-14 MED ORDER — CYCLOBENZAPRINE HCL 10 MG PO TABS: 10.0000 mg | ORAL_TABLET | Freq: Three times a day (TID) | ORAL | 0 refills | Status: AC | PRN

## 2023-07-14 MED ORDER — HYDROCODONE-ACETAMINOPHEN 7.5-325 MG/15ML PO SOLN: 15.0000 mL | Freq: Four times a day (QID) | ORAL | 0 refills | Status: DC | PRN

## 2023-07-14 MED ORDER — ONDANSETRON 8 MG PO TBDP
8.0000 mg | ORAL_TABLET | Freq: Once | ORAL | Status: AC
  Administered 2023-07-14: 8 mg via ORAL
  Filled 2023-07-14: qty 1

## 2023-07-14 MED ORDER — MORPHINE SULFATE (PF) 2 MG/ML IV SOLN: 2.0000 mg | Freq: Once | INTRAVENOUS | Status: DC

## 2023-07-14 MED ORDER — HYDROCODONE-ACETAMINOPHEN 10-325 MG PO TABS: 1.0000 | ORAL_TABLET | Freq: Four times a day (QID) | ORAL | 0 refills | Status: DC | PRN

## 2023-07-14 MED ORDER — MELOXICAM 15 MG PO TABS: 15.0000 mg | ORAL_TABLET | Freq: Every day | ORAL | 0 refills | Status: AC

## 2023-07-14 MED ORDER — MORPHINE SULFATE (PF) 4 MG/ML IV SOLN
4.0000 mg | Freq: Once | INTRAVENOUS | Status: AC
  Administered 2023-07-14: 4 mg via INTRAMUSCULAR
  Filled 2023-07-14: qty 1

## 2023-07-14 MED ORDER — FAMOTIDINE 20 MG PO TABS
20.0000 mg | ORAL_TABLET | Freq: Once | ORAL | Status: AC
  Administered 2023-07-14: 20 mg via ORAL
  Filled 2023-07-14: qty 1

## 2023-07-14 MED ORDER — FAMOTIDINE 20 MG PO TABS
20.0000 mg | ORAL_TABLET | Freq: Every day | ORAL | 0 refills | Status: AC
Start: 2023-07-14 — End: ?

## 2023-07-14 NOTE — Discharge Instructions (Addendum)
 You have been diagnosed with work accident, left shoulder pain, right lumbar strain, right knee pain.  Please take Norco 1 tablet by mouth every 6 hours as needed for pain.  Please take Flexeril  1 tablet by mouth every 8 hours after main meals.  Please take famotidine  1 tablet by mouth in the morning.  After you finish taking Norco you can start taking meloxicam  1 tablet by mouth daily with breakfast.  Please make an appointment with orthopedics doctors for a follow-up.  Make an appointment with your PCP as needed.  Please come back to ED or go to your PCP if you have new symptoms or symptoms worsen. Usted ha sido diagnosticado con acidente laboral, dolor en el hombre izquierdo, espasmo muscular en la region lumbar derecha, dolor en la rodilla izquierda. Por favor tome Norco cada 6 horas para Chief Technology Officer. Cuando termine el Norco por favor tome Meloxicam  una tableta con el desayuno una vez al dia. Tome Flexiril una tableta cada ocho horas despues de las comidas principales. Por favor llame al ortopedista y aque una cita para seguimiento. Puede llamar a su medico de cabecera y sacar una cita para seguimeinto. Vuelva a urgencias o vaya donde su medico de cabecera si tiene nuevos sintomas o si empeora.

## 2023-07-14 NOTE — ED Notes (Addendum)
 RN securely ACE bandaged (jones wrap) pt right leg due to pt habitus with knee immobilizer. PMS intact pre and post application. Provider notified of findings.

## 2023-07-14 NOTE — ED Provider Notes (Signed)
 [facility] Provider Note    Event Date/Time   First MD Initiated Contact with Patient 07/14/23 1550     (approximate)   History   Knee Pain    HPI  Brittany Pratt is a 48 y.o. female  with a past medical history of right total knee placement, pyelonephritis, menorrhagia  who presents to the ED complaining of   left shoulder pain, numbness on her right arm, lumbar pain, numbness of the right leg, numbness in the toes. According to the patient, she was at work she was walking and tripped and fall on her right knee, some containers fell on her right shoulder.  Patient denies loss of consciousness, blurry vision, vomit, urinary incontinence, fecal incontinence, symptoms of cauda equina.      Physical Exam   Triage Vital Signs: ED Triage Vitals  Encounter Vitals Group     BP 07/14/23 1305 (!) 152/98     Systolic BP Percentile --      Diastolic BP Percentile --      Pulse Rate 07/14/23 1305 78     Resp 07/14/23 1305 16     Temp 07/14/23 1305 98.2 F (36.8 C)     Temp Source 07/14/23 1305 Oral     SpO2 07/14/23 1305 97 %     Weight 07/14/23 1308 200 lb (90.7 kg)     Height 07/14/23 1308 5' (1.524 m)     Head Circumference --      Peak Flow --      Pain Score 07/14/23 1305 8     Pain Loc --      Pain Education --      Exclude from Growth Chart --     Most recent vital signs: Vitals:   07/14/23 1305 07/14/23 1730  BP: (!) 152/98 (!) 176/92  Pulse: 78 71  Resp: 16 12  Temp: 98.2 F (36.8 C) 98.2 F (36.8 C)  SpO2: 97% 100%     Constitutional: Alert, NAD. Able to speak in complete sentences without cough or dyspnea  Eyes: Conjunctivae are normal.  Head: Atraumatic. Nose: No congestion/rhinnorhea. Mouth/Throat: Mucous membranes are moist.   Neck: Painless ROM. Supple. No JVD, nodes, thyromegaly  Cardiovascular:   Good peripheral circulation.RRR no murmurs, gallops, rubs  Respiratory: Normal respiratory effort.  No  retractions. Clear to auscultation bilaterally without wheezing or crackles  Gastrointestinal: Soft and nontender.  Musculoskeletal:  Left shoulder: Skin is intact, no ecchymosis or hematomas.  Tender to palpation at the level of acromioclavicular joint.  Full ROM limited by pain.  Numbness on her 3rd, 4th and 5th finger.  Sensation decreased, strength 4/5, pulses positive. Thoracic spine: Skin is intact, no ecchymosis or hematomas, tender to palpation at the level of T11 or T12.  Lumbar spine: Skin is intact no ecchymosis or hematomas, tender to palpation at the level of L1-L4, tender to palpation in paraspinal muscles. Right knee: Presence of scar from previous surgery, edema, tender to palpation in the medial area.  Full ROM limited by pain Right leg: Numbness from the tight to the toes, sensation decreased.  Saddle anesthesia negative Neurologic:  MAE spontaneously. No gross focal neurologic deficits are appreciated.  Skin:  Skin is warm, dry and intact. No rash noted. Psychiatric: Mood and affect are normal. Speech and behavior are normal.    ED Results / Procedures / Treatments   Labs (all labs ordered are listed, but only abnormal results are displayed) Labs Reviewed - No  data to display   EKG     RADIOLOGY I independently reviewed and interpreted imaging and agree with radiologists findings.      PROCEDURES:  Critical Care performed:   Procedures   MEDICATIONS ORDERED IN ED: Medications  famotidine  (PEPCID ) tablet 20 mg (has no administration in time range)  ondansetron  (ZOFRAN -ODT) disintegrating tablet 8 mg (8 mg Oral Given 07/14/23 1712)  morphine  (PF) 4 MG/ML injection 4 mg (4 mg Intramuscular Given 07/14/23 1713)   Clinical Course as of 07/14/23 1836  Thu Jul 14, 2023  1558 DG Knee Complete 4 Views Right Total knee arthroplasty. [AE]  1738 DG Lumbar Spine Complete Unremarkable lumbar spine.  No acute fracture. [AE]  1738 DG Shoulder Left . No fracture or  malalignment. 2. Mild spurring along the glenoid. 3. Mild thoracic spondylosis.   [AE]  1822 Reassessed the patient, updated her with plan.  States having epigastric pain.  I ordered famotidine .  Patient agreed with the plan [AE]    Clinical Course User Index [AE] Awilda Lennox, PA-C    IMPRESSION / MDM / ASSESSMENT AND PLAN / ED COURSE  I reviewed the triage vital signs and the nursing notes.  Differential diagnosis includes, but is not limited to, left shoulder fracture, shoulder dislocation, lumbar strain, radiculopathy, fracture  Patient's presentation is most consistent with acute complicated illness / injury requiring diagnostic workup.   Patient's diagnosis is consistent with work accident, left shoulder soft tissue injury, right knee pain, right lumbar strain, epigastralgia.  I independently reviewed and interpreted imaging and agree with radiologists findings ruling out fracture, dislocation. I did review the patient's allergies and medications.During admission patient received morphine , knee brace, crutches.  The patient is in stable and satisfactory condition for discharge home  Patient will be discharged home with prescriptions for meloxicam , Norco, famotidine , Flexeril . Patient is to follow up with orthopedics, PCP as needed or otherwise directed. Patient is given ED precautions to return to the ED for any worsening or new symptoms. Discussed plan of care with patient, answered all of patient's questions, Patient agreeable to plan of care. Advised patient to take medications according to the instructions on the label. Discussed possible side effects of new medications. Patient verbalized understanding.    FINAL CLINICAL IMPRESSION(S) / ED DIAGNOSES   Final diagnoses:  Encounter for examination and observation following work accident  Acute pain of right knee  Lumbar strain, initial encounter  Acute pain of left shoulder     Rx / DC Orders   ED Discharge Orders           Ordered    HYDROcodone -acetaminophen  (HYCET) 7.5-325 mg/15 ml solution  4 times daily PRN,   Status:  Discontinued        07/14/23 1825    cyclobenzaprine  (FLEXERIL ) 10 MG tablet  3 times daily PRN        07/14/23 1825    meloxicam  (MOBIC ) 15 MG tablet  Daily        07/14/23 1825    famotidine  (PEPCID ) 20 MG tablet  Daily        07/14/23 1829    HYDROcodone -acetaminophen  (NORCO) 10-325 MG tablet  Every 6 hours PRN        07/14/23 1830             Note:  This document was prepared using Dragon voice recognition software and may include unintentional dictation errors.   Awilda Lennox, PA-C 07/14/23 2346    Claria Crofts, MD 07/18/23 [phone]

## 2023-07-14 NOTE — ED Triage Notes (Signed)
 Interpretor on stick used. Patient arrived by [facility]  Patient reports while at work getting tripped on an item and falling. Now having right knee pain and left arm pain. Reports unable to put weight on right leg. C/o numbness in right leg after fall   History right, total knee replacement X2 years ago  Patient wants to file workmans comp

## 2023-07-18 ENCOUNTER — Ambulatory Visit: Payer: Self-pay

## 2023-07-18 NOTE — Telephone Encounter (Signed)
  Chief Complaint: Back pain, leg pain Symptoms: lower back pain Frequency: since 07/14/2023 Pertinent Negatives: Patient denies CP, SOB Disposition: [] ED /[] Urgent Care (no appt availability in office) / [] Appointment(In office/virtual)/ []  [place] [facility]/ [] Home Care/ [] Refused Recommended Disposition /[] [facility] [facility]/ []  Follow-up with PCP Additional Notes: patient on the phone with Spanish interpreter [phone]. Patient was seen in the ED for work placed injury. Patient was endorses continued pain to both knees and back. Patient states pain from lower back does radiate into her legs. More on the right but patient endorses pain to the left. Patient was calling to make a post ED follow up visit. Per protocol, due to pain in both legs patient is recommended to the ED due to continued pain. Post hospital follow up visit is scheduled for this Thursday 07/21/2023 at 11:20 AM. Patient verbalized understanding and all questions answered. Patient unsure if she will be seen again in ED but will see her PCP on Thursday.    Copied from CRM [id number]. Topic: Clinical - Red Word Triage >> Jul 18, 2023 11:22 AM Lizabeth Riggs wrote: Red Word that prompted transfer to Nurse Triage:  She went to emergency room for a work related accident.  She has pain in her left knee and left shoulder. She said level of pain is an 8 out of 10.   Emergency room had wrong knee and shoulder. Reason for Disposition  [1] Pain radiates into the thigh or further down the leg AND [2] both legs  Answer Assessment - Initial Assessment Questions 1. ONSET: "When did the pain begin?"      Started on Thursday with a work related injury. 2. LOCATION: "Where does it hurt?" (upper, mid or lower back)     Lower back pain 3. SEVERITY: "How bad is the pain?"  (e.g., Scale 1-10; mild, moderate, or severe)   - MILD (1-3): Doesn't interfere with normal activities.    - MODERATE (4-7): Interferes with normal activities or awakens from  sleep.    - SEVERE (8-10): Excruciating pain, unable to do any normal activities.      8 4. PATTERN: "Is the pain constant?" (e.g., yes, no; constant, intermittent)      constant 5. RADIATION: "Does the pain shoot into your legs or somewhere else?"     legs 6. CAUSE:  "What do you think is causing the back pain?"      unsure 7. BACK OVERUSE:  "Any recent lifting of heavy objects, strenuous work or exercise?"     no 8. MEDICINES: "What have you taken so far for the pain?" (e.g., nothing, acetaminophen , NSAIDS)     Hydrocodone , Meloxicam , Flexeril  9. NEUROLOGIC SYMPTOMS: "Do you have any weakness, numbness, or problems with bowel/bladder control?"     no 10. OTHER SYMPTOMS: "Do you have any other symptoms?" (e.g., fever, abdomen pain, burning with urination, blood in urine)       no 11. PREGNANCY: "Is there any chance you are pregnant?" "When was your last menstrual period?"       no  Protocols used: Back Pain-A-AH

## 2023-07-21 ENCOUNTER — Ambulatory Visit (INDEPENDENT_AMBULATORY_CARE_PROVIDER_SITE_OTHER): Payer: Self-pay | Admitting: Internal Medicine

## 2023-07-21 ENCOUNTER — Encounter: Payer: Self-pay | Admitting: Internal Medicine

## 2023-07-21 VITALS — BP 140/90 | Ht 60.0 in | Wt 211.6 lb

## 2023-07-21 DIAGNOSIS — M25562 Pain in left knee: Secondary | ICD-10-CM

## 2023-07-21 DIAGNOSIS — M25512 Pain in left shoulder: Secondary | ICD-10-CM

## 2023-07-21 DIAGNOSIS — M25561 Pain in right knee: Secondary | ICD-10-CM

## 2023-07-21 DIAGNOSIS — M545 Low back pain, unspecified: Secondary | ICD-10-CM

## 2023-07-21 DIAGNOSIS — Z9181 History of falling: Secondary | ICD-10-CM

## 2023-07-21 NOTE — Progress Notes (Signed)
 Subjective:    Patient ID: Brittany Pratt, female    DOB: June 07, 1975, 48 y.o.   MRN: [id number]  HPI  Discussed the use of AI scribe software for clinical note transcription with the patient, who gave verbal consent to proceed.  Brittany Pratt is a 48 year old female who presents with pain following a work-related fall.  On Jul 14, 2023, she experienced a work-related accident where she tripped and fell, landing on her bilateral knees. A box then fell and landed on her left shoulder, and also injuring her back. X-rays of her lower spine, left shoulder, and right knee showed no fractures. She was provided with a brace, which she finds too tight and does not wear often, and crutches, which she uses due to significant pain when standing.  She reports persistent pain in her back, left shoulder, and both knees, with the right knee being particularly affected. She has a history of knee replacement surgery on the right knee two years ago. The pain in her left shoulder is exacerbated by certain movements, such as raising her arm behind her head. She is experiencing weakness of the left upper and right lower extremity but denies numbness or tingling,   She was prescribed meloxicam , a muscle relaxer (Flexeril ), and hydrocodone  for pain management. She is currently not working due to the pain and is awaiting an orthopedist appointment on Jul 27, 2023, to further evaluate her condition and determine her ability to return to work.       Review of Systems   Past Medical History:  Diagnosis Date   Healthy adult on routine physical examination    Hypertension     Current Outpatient Medications  Medication Sig Dispense Refill   famotidine  (PEPCID ) 20 MG tablet Take 1 tablet (20 mg total) by mouth daily. 30 tablet 0   HYDROcodone -acetaminophen  (NORCO) 10-325 MG tablet Take 1 tablet by mouth every 6 (six) hours as needed. 20 tablet 0   ibuprofen  (ADVIL ) 600 MG tablet Take 1  tablet (600 mg total) by mouth every 8 (eight) hours as needed. 30 tablet 0   meloxicam  (MOBIC ) 15 MG tablet Take 1 tablet (15 mg total) by mouth daily for 7 days. 7 tablet 0   No current facility-administered medications for this visit.    Allergies  Allergen Reactions   Ace Inhibitors Cough    Family History  Problem Relation Age of Onset   Cancer Mother        stomach, Uterine   Diabetes Mother    Heart disease Son        aortic valve replacement at 77 yo   Hyperlipidemia Maternal Aunt    Hypertension Maternal Aunt    Diabetes Maternal Aunt    Hypertension Maternal Uncle    Hyperlipidemia Maternal Uncle    Diabetes Maternal Uncle     Social History   Socioeconomic History   Marital status: Divorced    Spouse name: Not on file   Number of children: Not on file   Years of education: Not on file   Highest education level: Not on file  Occupational History   Not on file  Tobacco Use   Smoking status: Never   Smokeless tobacco: Never  Vaping Use   Vaping status: Never Used  Substance and Sexual Activity   Alcohol use: No   Drug use: No   Sexual activity: Not Currently  Other Topics Concern   Not on file  Social History Narrative  Not on file   Social Drivers of Health   Financial Resource Strain: Not on file  Food Insecurity: Not on file  Transportation Needs: Not on file  Physical Activity: Not on file  Stress: Not on file  Social Connections: Not on file  Intimate Partner Violence: Not on file     Constitutional: Denies fever, malaise, fatigue, headache or abrupt weight changes.  Respiratory: Denies difficulty breathing, shortness of breath, cough or sputum production.   Cardiovascular: Denies chest pain, chest tightness, palpitations or swelling in the hands or feet.  Musculoskeletal: Pt reports left shoulder pain, bilateral knee pain, low back pain, decrease in range of motion and difficulty with gait. Denies muscle pain or joint swelling.  Skin:  Denies redness, rashes, lesions or ulcercations.  Neurological: Denies dizziness, difficulty with memory, difficulty with speech or problems with balance and coordination.   No other specific complaints in a complete review of systems (except as listed in HPI above).      Objective:   Physical Exam  BP (!) 140/90   Ht 5' (1.524 m)   Wt 211 lb 9.6 oz (96 kg)   LMP 07/13/2023 (Exact Date)   BMI 41.33 kg/m   Wt Readings from Last 3 Encounters:  07/14/23 200 lb (90.7 kg)  01/13/21 191 lb (86.6 kg)  01/05/21 188 lb 6.4 oz (85.5 kg)    General: Appears her stated age, obese, in NAD. Skin: Warm, dry and intact. No bruising or abrasions noted. Cardiovascular: Normal rate and rhythm. S1,S2 noted.  No murmur, rubs or gallops noted.  Pulmonary/Chest: Normal effort and positive vesicular breath sounds. No respiratory distress. No wheezes, rales or ronchi noted.  Musculoskeletal: Decrease active internal and external rotation of the left shoulder secondary to pain. Generalized pain with palpation of the left shoulder and bilateral knees. Shoulder shrug equal. Strength 5/5 BUE. Hand grips unequal R>L. Normal flexion and rotation of the spine. Pain with extension. Bony tenderness noted over the lower lumbar spine and right paralumbar muscles. Joint enlargement noted of left knee without effusion. Strength 4/5 BLE. She has difficulty getting from a sitting to a standing position. Gait slow but steady with use of crutches. Neurological: Alert and oriented.     BMET    Component Value Date/Time   NA 142 01/05/2021 1555   NA 137 05/23/2019 0824   K 4.0 01/05/2021 1555   CL 102 01/05/2021 1555   CO2 31 01/05/2021 1555   GLUCOSE 112 01/05/2021 1555   BUN 21 01/05/2021 1555   BUN 11 05/23/2019 0824   CREATININE 0.84 01/05/2021 1555   CALCIUM 10.5 (H) 01/05/2021 1555   GFRNONAA 112 10/26/2019 1341   GFRAA 130 10/26/2019 1341    Lipid Panel     Component Value Date/Time   CHOL 203 (H)  05/23/2019 0824   TRIG 115 05/23/2019 0824   HDL 56 05/23/2019 0824   CHOLHDL 3.3 04/25/2018 0948   VLDL 11 09/13/2016 0911   LDLCALC 127 (H) 05/23/2019 0824   LDLCALC 117 (H) 04/25/2018 0948    CBC    Component Value Date/Time   WBC 9.3 01/05/2021 1555   RBC 5.12 (H) 01/05/2021 1555   HGB 13.8 01/05/2021 1555   HGB 12.8 05/23/2019 0824   HCT 42.3 01/05/2021 1555   HCT 38.0 05/23/2019 0824   PLT 279 01/05/2021 1555   PLT 262 05/23/2019 0824   MCV 82.6 01/05/2021 1555   MCV 83 05/23/2019 0824   MCH 27.0 01/05/2021 1555  MCHC 32.6 01/05/2021 1555   RDW 12.7 01/05/2021 1555   RDW 13.0 05/23/2019 0824   LYMPHSABS 2,222 10/26/2019 1341   LYMPHSABS 1.9 05/23/2019 0824   MONOABS 0.7 11/11/2014 0410   EOSABS 152 10/26/2019 1341   EOSABS 0.1 05/23/2019 0824   BASOSABS 71 10/26/2019 1341   BASOSABS 0.1 05/23/2019 0824    Hgb A1C Lab Results  Component Value Date   HGBA1C CANCELED 10/30/2019            Assessment & Plan:    Assessment and Plan    Bilateral knee, lower back and left shoulder pain post-fall ER notes and imaging reviewed. Persistent pain in right knee and left shoulder post-fall. No fractures on X-ray. Pain management with meloxicam , flexeril , and hydrocodone . Awaiting orthopedic evaluation. - Provide work note for absence until May 14th. - Continue meloxicam , flexeril , and hydrocodone . - Follow-up with orthopedist on May 14th.  Schedule an appointment for your annual exam Helayne Lo, NP

## 2023-07-21 NOTE — Patient Instructions (Signed)
 RICE Therapy for Routine Care of Injuries Many injuries can be cared for with rest, ice, compression, and elevation. This is also called RICE therapy. RICE therapy includes: Resting the injured body part. Putting ice on the injury. Putting pressure on the injury. This is also called compression. Raising the injured part. This is also called elevation. RICE therapy can help reduce pain and swelling. Supplies needed: Ice. Plastic bag. Towel. Elastic bandage. Pillow or pillows to raise the injured body part. How to care for your injury with RICE therapy Rest Try to rest the injured part of your body. You can go back to your normal activities when your health care provider says it's okay to do them and when you can do them without pain. Ask what things are safe for you to do. Some injuries heal better with early movement instead of resting. If you rest the injury too much, it may not heal as well. Ask your provider if you should do exercises to help your injury get better. Ice Putting ice on your injury can help to lessen swelling and pain. Do not apply ice directly to your skin. Use ice on as many days as told by your provider. If told, put ice on the area. Put ice in a plastic bag. Place a towel between your skin and the bag. Leave the ice on for 20 minutes, 2-3 times a day. If your skin turns bright red, take off the ice right away to prevent skin damage. The risk of damage is higher if you can't feel pain, heat, or cold.  Compression Put pressure, also called compression, on your injured area. This can be done with an elastic bandage. If this type of bandage has been put on your injury: Follow instructions on the package the bandage came in about how to use it. Do not wrap the bandage too tightly. Wrap the bandage more loosely if part of your body beyond the bandage looks blue, or is swollen, cold, painful, or loses feeling. Take off the bandage and put it on again every 3-4 hours or  as told by your provider. Call your provider if the bandage seems to make your injury worse.  Elevation Raise the injured area above the level of your heart while you're sitting or lying down. Use a pillow to support your injured area as needed. Follow these instructions at home: If your symptoms get worse or last a long time, make a follow-up appointment with your provider. You may need to have imaging tests, such as X-rays or an MRI. If you have imaging tests, ask how to get your results when they are ready. Contact a health care provider if: You keep having pain and swelling. Your symptoms get worse. Get help right away if: You have sudden, very bad pain at your injury or lower than your injury. You have tingling or numbness at your injury or lower than your injury, and it does not go away when you take the bandage off. This information is not intended to replace advice given to you by your health care provider. Make sure you discuss any questions you have with your health care provider. Document Revised: 12/02/2022 Document Reviewed: 05/17/2022 Elsevier Patient Education  2024 [employer].

## 2023-09-19 ENCOUNTER — Other Ambulatory Visit (HOSPITAL_COMMUNITY): Payer: Self-pay

## 2023-09-19 ENCOUNTER — Other Ambulatory Visit: Payer: Self-pay

## 2023-11-02 ENCOUNTER — Other Ambulatory Visit: Payer: Self-pay

## 2023-11-02 DIAGNOSIS — Z87898 Personal history of other specified conditions: Secondary | ICD-10-CM

## 2023-11-03 ENCOUNTER — Other Ambulatory Visit: Payer: Self-pay

## 2023-11-03 DIAGNOSIS — N6320 Unspecified lump in the left breast, unspecified quadrant: Secondary | ICD-10-CM

## 2023-12-01 ENCOUNTER — Other Ambulatory Visit: Payer: Self-pay | Admitting: Family Medicine

## 2023-12-01 DIAGNOSIS — G959 Disease of spinal cord, unspecified: Secondary | ICD-10-CM

## 2023-12-02 ENCOUNTER — Ambulatory Visit (HOSPITAL_BASED_OUTPATIENT_CLINIC_OR_DEPARTMENT_OTHER): Payer: Worker's Compensation

## 2023-12-02 ENCOUNTER — Ambulatory Visit (HOSPITAL_BASED_OUTPATIENT_CLINIC_OR_DEPARTMENT_OTHER)
Admission: RE | Admit: 2023-12-02 | Discharge: 2023-12-02 | Disposition: A | Discharge status: Home / Self Care | Payer: Worker's Compensation | Source: Ambulatory Visit | Attending: Family Medicine | Admitting: Family Medicine

## 2023-12-02 ENCOUNTER — Ambulatory Visit (HOSPITAL_BASED_OUTPATIENT_CLINIC_OR_DEPARTMENT_OTHER): Admission: RE | Admit: 2023-12-02 | Payer: Worker's Compensation | Source: Ambulatory Visit

## 2023-12-02 ENCOUNTER — Encounter (HOSPITAL_BASED_OUTPATIENT_CLINIC_OR_DEPARTMENT_OTHER): Payer: Self-pay

## 2023-12-02 DIAGNOSIS — G959 Disease of spinal cord, unspecified: Secondary | ICD-10-CM | POA: Diagnosis present

## 2023-12-29 ENCOUNTER — Ambulatory Visit

## 2023-12-29 ENCOUNTER — Encounter

## 2023-12-29 ENCOUNTER — Other Ambulatory Visit

## 2024-01-05 ENCOUNTER — Ambulatory Visit

## 2024-01-05 ENCOUNTER — Ambulatory Visit
Admission: RE | Admit: 2024-01-05 | Discharge: 2024-01-05 | Disposition: A | Discharge status: Home / Self Care | Source: Ambulatory Visit | Attending: Obstetrics and Gynecology | Admitting: Obstetrics and Gynecology

## 2024-01-05 ENCOUNTER — Ambulatory Visit: Payer: Self-pay | Admitting: *Deleted

## 2024-01-05 VITALS — Wt 207.0 lb

## 2024-01-05 DIAGNOSIS — Z1239 Encounter for other screening for malignant neoplasm of breast: Secondary | ICD-10-CM

## 2024-01-05 DIAGNOSIS — N644 Mastodynia: Secondary | ICD-10-CM

## 2024-01-05 DIAGNOSIS — Z1211 Encounter for screening for malignant neoplasm of colon: Secondary | ICD-10-CM

## 2024-01-05 DIAGNOSIS — Z87898 Personal history of other specified conditions: Secondary | ICD-10-CM

## 2024-01-05 NOTE — Patient Instructions (Signed)
 Explained breast self awareness with Brittany Pratt. Patient did not need a Pap smear today due to last Pap smear and HPV typing was 11/07/2019. Let her know [facility] will cover Pap smears and HPV typing every 5 years unless has a history of abnormal Pap smears. Referred patient to the [facility] for a diagnostic mammogram. Appointment scheduled Thursday, January 05, 2024 at 1400. Patient aware of appointment and will be there. Brittany Pratt verbalized understanding.  Deamonte Sayegh, Wanda Ship, RN 1:00 PM

## 2024-01-05 NOTE — Progress Notes (Signed)
 Brittany Pratt is a 48 y.o. female who presents to [facility] clinic today with complaint of right breast lump for over a year.    Pap Smear: Pap smear not completed today. Last Pap smear was 11/07/2019 at a clinic in [place] and was normal with negative HPV. Per patient has no history of an abnormal Pap smear. Last Pap smear result is available in Epic.   Physical exam: Breasts Breasts symmetrical. No skin abnormalities bilateral breasts. No nipple retraction bilateral breasts. No nipple discharge bilateral breasts. No lymphadenopathy. No lumps palpated bilateral breasts. Unable to palpate a lump within patients area of concern. Complaints of right lower breast pain on exam.     MM DIAG BREAST TOMO BILATERAL Result Date: 02/23/2019 CLINICAL DATA:  Patient presents for bilateral diagnostic examination to follow-up a probable benign right breast mass. Patient is due for her annual bilateral mammogram. EXAM: DIGITAL DIAGNOSTIC bilateral MAMMOGRAM WITH CAD AND TOMO ULTRASOUND right BREAST COMPARISON:  Previous exam(s). ACR Breast Density Category b: There are scattered areas of fibroglandular density. FINDINGS: Examination demonstrates a partially circumscribed and partially obscured oval mass over the anterior third of the outer midportion of the right breast. Remainder of the right breast as well as the left breast is unchanged. Mammographic images were processed with CAD. Targeted ultrasound is performed, showing in oval circumscribed hypoechoic mass at the 10 o'clock position of the right breast 2 cm from the nipple measuring 0.7 x 1.9 x 2 cm as this is unchanged and likely benign. This likely represents a hamartoma or fibroadenoma. IMPRESSION: Stable probable benign 2 cm mass over the 10 o'clock position of the right breast 2 cm from the nipple. RECOMMENDATION: Recommend 1 additional follow-up diagnostic right breast mammogram and ultrasound in 1 year at the time of patient's annual bilateral  mammogram in December 2021 to document 2 years of stability. I have discussed the findings and recommendations with the patient. If applicable, a reminder letter will be sent to the patient regarding the next appointment. BI-RADS CATEGORY  3: Probably benign. Electronically Signed   By: Toribio Agreste M.D.   On: 02/23/2019 12:49   Pelvic/Bimanual Pap is not indicated today per BCCCP guidelines.   Smoking History: Patient has never smoked.  Patient Navigation: Patient education provided. Access to services provided for patient through [facility] program. Spanish interpreter Bernice Angry from [facility] provided.   Colorectal Cancer Screening: Per patient has never had colonoscopy completed. FIT Test given to patient to complete. No complaints today.    Breast and Cervical Cancer Risk Assessment: Patient does not have family history of breast cancer, known genetic mutations, or radiation treatment to the chest before age 66. Patient does not have history of cervical dysplasia, immunocompromised, or DES exposure in-utero.  Risk Scores as of Encounter on 01/05/2024     [place]           5-year 0.76%   Lifetime 8.52%   This patient is [place] but has no documented birth country, so the [place] model used data from [place] patients to calculate their risk score. Document a birth country in the Demographics activity for a more accurate score.         Last calculated by Silas, Ansyi K, CMA on 01/05/2024 at  1:11 PM       A: BCCCP exam without pap smear Complaint of right breast lump  P: Referred patient to [facility] for a diagnostic mammogram. Appointment scheduled Thursday, January 05, 2024 at  1400.  Driscilla Wanda SQUIBB, RN 01/05/2024 10:32 AM

## 2024-02-01 NOTE — Progress Notes (Signed)
 This note has been created using automated tools and reviewed for accuracy by [facility]  Chief Complaint  Patient presents with  . Left Knee - Follow-up    Subjective  Brittany Pratt is a 48 y.o. female who presents for Follow-up of the Left Knee HPI History of Present Illness Brittany Pratt is a 48 year old female with severe osteoarthritis who presents with bilateral knee pain and back pain from a work injury.  She experiences bilateral knee pain, with the left knee having sharp pain that began four days ago, waking her from sleep. The pain is described as 'all around' the knee, with a sensation of puffiness and tightness, worsening with movement. She has difficulty walking long distances and relies on automatic wheelchairs for shopping. She attempted to cook but could not stand for more than an hour due to knee and back pain. She had a right knee replacement four years ago. Imaging of the left knee in June showed severe arthritis.  She has back pain from a work injury several months ago. An epidural steroid injection provided some relief, allowing her to sit for longer periods, but did not alleviate leg pain. She has difficulty bending and standing for extended periods, impacting her daily activities.  She reports left neck and shoulder pain with weakness in the arm. She previously received injections for a frozen shoulder, which provided some relief. She is not currently in physical therapy as it was discontinued due to exacerbation of her back pain.  Her current medications include Tylenol , as she is out of cyclobenzaprine  and etodolac. She has not received tramadol yet.  Review of Systems  Patient Active Problem List  Diagnosis  . Tension-type headache, not intractable  . Headache disorder  . Bilateral occipital neuralgia  . Essential hypertension  . GERD (gastroesophageal reflux disease)  . History of total right knee replacement  . Osteoarthritis of right knee     Outpatient Medications Prior to Visit  Medication Sig Dispense Refill  . ACETAMINOPHEN  (TYLENOL  EXTRA STRENGTH ORAL) Take by mouth. Reported on 07/23/2015     . cyclobenzaprine  (FLEXERIL ) 10 MG tablet Take 2 tablets (20 mg total) by mouth 3 (three) times daily as needed for Muscle spasms (Patient not taking: Reported on 02/01/2024) 45 tablet 1  . etodolac (LODINE) 400 MG tablet Take 1 tablet (400 mg total) by mouth 2 (two) times daily (Patient not taking: Reported on 02/01/2024) 60 tablet 11  . famotidine  (PEPCID ) 20 MG tablet Take 20 mg by mouth once daily (Patient not taking: Reported on 02/01/2024)    . HYDROcodone -acetaminophen  (NORCO) 10-325 mg tablet Take 1 tablet by mouth every 6 (six) hours as needed (Patient not taking: Reported on 02/01/2024)    . ondansetron  (ZOFRAN -ODT) 4 MG disintegrating tablet Take 4 mg by mouth every 8 (eight) hours as needed for Nausea (Patient not taking: Reported on 02/01/2024)    . oxyCODONE  (ROXICODONE ) 5 MG immediate release tablet Take 5 mg by mouth every 4 (four) hours as needed for Pain (Patient not taking: Reported on 02/01/2024)     No facility-administered medications prior to visit.      Objective  Vitals:   02/01/24 0829  BP: (!) 142/78  Weight: 93.9 kg (207 lb)  Height: 152.4 cm (5')  PainSc:   8  PainLoc: Knee   Body mass index is 40.43 kg/m.  Home Vitals:     Physical Exam Physical Exam GENERAL: Alert, cooperative, well developed, no acute distress. HEENT: Normocephalic, normal oropharynx,  moist mucous membranes. CHEST: Clear to auscultation bilaterally, no wheezes, rhonchi, or crackles. CARDIOVASCULAR: Normal heart rate and rhythm, S1 and S2 normal without murmurs. ABDOMEN: Soft, non-tender, non-distended, without organomegaly, normal bowel sounds. EXTREMITIES: No cyanosis or edema. MUSCULOSKELETAL: Full knee extension with pain, stops at 20 degrees actively, passively to full extension. Shoulder actively flexes to 80  degrees, passively to 170 degrees. NEUROLOGICAL: Cranial nerves grossly intact, moves all extremities without gross motor or sensory deficit.  Results RADIOLOGY Right knee X-ray: Severe arthritis (07/2023) Left knee X-ray: Severe arthritis, medial compartment bone-on-bone, patellar degeneration (08/2023) Lumbar spine MRI: L4-5 disc bulge, left paracentral disc protrusion, moderate facet arthropathy Cervical spine MRI: C2-3 disc bulge     Assessment/Plan:   Assessment & Plan Left knee severe primary osteoarthritis   Severe osteoarthritis in the left knee results in bone-on-bone contact and a severely affected kneecap, causing significant pain with motion and difficulty in mobility, including walking and standing for long periods. Work comp does not cover arthritis-related issues, complicating treatment. Continue current pain management with acetaminophen . Discussed limitations of work comp coverage for arthritis-related issues.  Right knee pain and laxity status post total knee arthroplasty   Four years post right knee replacement, she experiences pain and laxity, though the implants remain secure. Laxity may have been present before the injury.  Chronic low back pain with lumbar disc bulging and facet arthritis   Chronic low back pain is associated with lumbar disc bulging at L4-5 and moderate facet arthritis. A recent epidural steroid injection provided some relief for back pain, allowing longer sitting periods, but did not alleviate leg pain. MRI findings do not suggest a need for surgical intervention. Continue follow-up with physiatry for back pain management and consider physical therapy if recommended.  Left shoulder partial rotator cuff tear with pain   A partial rotator cuff tear in the left shoulder causes significant pain and weakness. Previous injections relieved frozen shoulder, but current therapy is not in place. Physical therapy was previously stopped due to exacerbation of  back pain. Ordered physical therapy for the left shoulder and continue follow-up with a shoulder specialist for ongoing management.  Recording duration: 15 minutes Diagnoses and all orders for this visit:  Primary localized osteoarthritis of left knee  Traumatic incomplete tear of left rotator cuff, subsequent encounter -     Ambulatory Referral to Physical Therapy  History of total right knee replacement  Instability of prosthetic knee, subsequent encounter  Other orders -     etodolac (LODINE) 400 MG tablet; Take 1 tablet (400 mg total) by mouth 2 (two) times daily    This visit was coded based on medical decision making (MDM).           Future Appointments     Date/Time Provider Department Center Visit Type   02/17/2024 11:15 AM (Arrive by 11:00 AM) Carlisle, Benton, NP [facility] C RETURN VISIT   02/29/2024 10:45 AM Tobie Earnestine Clamp, MD [facility] C ORTHO RETURN       There are no Patient Instructions on file for this visit.  An after visit summary was provided for the patient either in written format (printed) or through [facility].  This note has been created using automated tools and reviewed for accuracy by [facility].
